# Patient Record
Sex: Female | Born: 1945
Health system: Southern US, Community
[De-identification: ages and names within clinical notes are randomized; demographics above are authoritative.]

## PROBLEM LIST (undated history)

## (undated) DIAGNOSIS — M8580 Other specified disorders of bone density and structure, unspecified site: Secondary | ICD-10-CM

## (undated) DIAGNOSIS — E2839 Other primary ovarian failure: Secondary | ICD-10-CM

## (undated) DIAGNOSIS — T7840XA Allergy, unspecified, initial encounter: Secondary | ICD-10-CM

## (undated) DIAGNOSIS — M25512 Pain in left shoulder: Secondary | ICD-10-CM

## (undated) DIAGNOSIS — E785 Hyperlipidemia, unspecified: Secondary | ICD-10-CM

## (undated) DIAGNOSIS — N952 Postmenopausal atrophic vaginitis: Secondary | ICD-10-CM

## (undated) DIAGNOSIS — R928 Other abnormal and inconclusive findings on diagnostic imaging of breast: Secondary | ICD-10-CM

## (undated) HISTORY — DX: Other primary ovarian failure: E28.39

## (undated) HISTORY — PX: TONSILLECTOMY: SUR1361

## (undated) HISTORY — DX: Hyperlipidemia, unspecified: E78.5

## (undated) HISTORY — DX: Postmenopausal atrophic vaginitis: N95.2

## (undated) HISTORY — PX: CHOLECYSTECTOMY: SHX55

## (undated) HISTORY — DX: Pain in left shoulder: M25.512

## (undated) HISTORY — DX: Allergy, unspecified, initial encounter: T78.40XA

## (undated) HISTORY — PX: BREAST BIOPSY: SHX20

## (undated) HISTORY — PX: COLONOSCOPY: SHX174

## (undated) HISTORY — DX: Other specified disorders of bone density and structure, unspecified site: M85.80

## (undated) HISTORY — DX: Other abnormal and inconclusive findings on diagnostic imaging of breast: R92.8

## (undated) HISTORY — PX: TUBAL LIGATION: SHX77

---

## 2012-08-04 DIAGNOSIS — L723 Sebaceous cyst: Secondary | ICD-10-CM | POA: Diagnosis not present

## 2012-09-28 DIAGNOSIS — J209 Acute bronchitis, unspecified: Secondary | ICD-10-CM | POA: Diagnosis not present

## 2013-01-28 DIAGNOSIS — Z1239 Encounter for other screening for malignant neoplasm of breast: Secondary | ICD-10-CM | POA: Diagnosis not present

## 2013-01-28 DIAGNOSIS — Z0000 Encounter for general adult medical examination without abnormal findings: Secondary | ICD-10-CM | POA: Diagnosis not present

## 2013-01-28 DIAGNOSIS — Z13220 Encounter for screening for lipoid disorders: Secondary | ICD-10-CM | POA: Diagnosis not present

## 2013-01-28 DIAGNOSIS — Z23 Encounter for immunization: Secondary | ICD-10-CM | POA: Diagnosis not present

## 2013-01-28 DIAGNOSIS — Z131 Encounter for screening for diabetes mellitus: Secondary | ICD-10-CM | POA: Diagnosis not present

## 2013-01-28 DIAGNOSIS — Z1159 Encounter for screening for other viral diseases: Secondary | ICD-10-CM | POA: Diagnosis not present

## 2013-01-28 DIAGNOSIS — Z124 Encounter for screening for malignant neoplasm of cervix: Secondary | ICD-10-CM | POA: Diagnosis not present

## 2013-01-31 DIAGNOSIS — Z124 Encounter for screening for malignant neoplasm of cervix: Secondary | ICD-10-CM | POA: Diagnosis not present

## 2013-02-17 ENCOUNTER — Ambulatory Visit: Payer: Self-pay | Admitting: Family Medicine

## 2013-02-17 DIAGNOSIS — E2839 Other primary ovarian failure: Secondary | ICD-10-CM | POA: Diagnosis not present

## 2013-02-17 DIAGNOSIS — M899 Disorder of bone, unspecified: Secondary | ICD-10-CM | POA: Diagnosis not present

## 2013-02-17 DIAGNOSIS — N63 Unspecified lump in unspecified breast: Secondary | ICD-10-CM | POA: Diagnosis not present

## 2013-02-17 DIAGNOSIS — M810 Age-related osteoporosis without current pathological fracture: Secondary | ICD-10-CM | POA: Diagnosis not present

## 2013-02-17 DIAGNOSIS — Z1231 Encounter for screening mammogram for malignant neoplasm of breast: Secondary | ICD-10-CM | POA: Diagnosis not present

## 2013-02-17 LAB — HM DEXA SCAN: HM DEXA SCAN: ABNORMAL

## 2013-02-20 ENCOUNTER — Ambulatory Visit: Payer: Self-pay | Admitting: Family Medicine

## 2013-02-20 DIAGNOSIS — R928 Other abnormal and inconclusive findings on diagnostic imaging of breast: Secondary | ICD-10-CM | POA: Diagnosis not present

## 2013-02-20 DIAGNOSIS — R920 Mammographic microcalcification found on diagnostic imaging of breast: Secondary | ICD-10-CM | POA: Diagnosis not present

## 2013-02-24 ENCOUNTER — Ambulatory Visit: Payer: Self-pay | Admitting: Gastroenterology

## 2013-02-24 DIAGNOSIS — D126 Benign neoplasm of colon, unspecified: Secondary | ICD-10-CM | POA: Diagnosis not present

## 2013-02-24 DIAGNOSIS — Z1211 Encounter for screening for malignant neoplasm of colon: Secondary | ICD-10-CM | POA: Diagnosis not present

## 2013-02-24 DIAGNOSIS — K648 Other hemorrhoids: Secondary | ICD-10-CM | POA: Diagnosis not present

## 2013-02-24 DIAGNOSIS — Z833 Family history of diabetes mellitus: Secondary | ICD-10-CM | POA: Diagnosis not present

## 2013-02-24 DIAGNOSIS — K5730 Diverticulosis of large intestine without perforation or abscess without bleeding: Secondary | ICD-10-CM | POA: Diagnosis not present

## 2013-02-24 DIAGNOSIS — Z8489 Family history of other specified conditions: Secondary | ICD-10-CM | POA: Diagnosis not present

## 2013-02-24 DIAGNOSIS — E739 Lactose intolerance, unspecified: Secondary | ICD-10-CM | POA: Diagnosis not present

## 2013-02-24 LAB — HM COLONOSCOPY: HM Colonoscopy: ABNORMAL

## 2013-02-25 LAB — PATHOLOGY REPORT

## 2013-02-28 ENCOUNTER — Encounter: Payer: Self-pay | Admitting: *Deleted

## 2013-03-06 ENCOUNTER — Encounter: Payer: Self-pay | Admitting: General Surgery

## 2013-03-06 ENCOUNTER — Ambulatory Visit (INDEPENDENT_AMBULATORY_CARE_PROVIDER_SITE_OTHER): Payer: Medicare Other | Admitting: General Surgery

## 2013-03-06 VITALS — BP 126/64 | HR 70 | Resp 12 | Ht 63.0 in | Wt 185.0 lb

## 2013-03-06 DIAGNOSIS — N63 Unspecified lump in unspecified breast: Secondary | ICD-10-CM

## 2013-03-06 HISTORY — PX: BREAST BIOPSY: SHX20

## 2013-03-06 NOTE — Progress Notes (Signed)
Patient ID: Vanessa Hunter, female   DOB: [birth year], 67 y.o.   MRN: [id number]  Chief Complaint  Patient presents with  . Other    mammogram    HPI Vanessa Hunter is a 67 y.o. female who presents for a breast evaluation. The most recent mammogram was done on 02/17/13 with a birad category 0. Additional views were done bilaterally on 02/20/13 with a birad category 4. A right breast ultrasound was done at that same time with a birad category 4.  Patient does perform regular self breast checks but does not get regular mammograms done.  The patient denies any problems with her breasts at this time.   HPI  Past Medical History  Diagnosis Date  . Abnormal mammogram     Past Surgical History  Procedure Laterality Date  . Breast biopsy Right   . Cholecystectomy    . Tonsillectomy    . Colonoscopy      Family History  Problem Relation Age of Onset  . Cancer Maternal Aunt     breast    Social History History  Substance Use Topics  . Smoking status: Never Smoker   . Smokeless tobacco: Never Used  . Alcohol Use: Yes    Allergies  Allergen Reactions  . Orange Juice (Orange Oil) Itching    Current Outpatient Prescriptions  Medication Sig Dispense Refill  . Calcium Carbonate-Vitamin D (CALCIUM + D PO) Take 600 mg by mouth daily.       No current facility-administered medications for this visit.    Review of Systems Review of Systems  Constitutional: Negative.   Respiratory: Negative.   Cardiovascular: Negative.     Blood pressure 126/64, pulse 70, resp. rate 12, height 5\' 3"  (1.6 m), weight 185 lb (83.915 kg).  Physical Exam Physical Exam  Constitutional: She is oriented to person, place, and time. She appears well-developed and well-nourished.  Neck: No thyromegaly present.  Cardiovascular: Normal rate, regular rhythm and normal heart sounds.   No murmur heard. Pulmonary/Chest: Effort normal and breath sounds normal. Right breast exhibits no inverted nipple, no mass, no  nipple discharge, no skin change and no tenderness. Left breast exhibits no inverted nipple, no mass, no nipple discharge, no skin change and no tenderness.  Left breast 1 cup size larger than right.     Lymphadenopathy:    She has no cervical adenopathy.    She has no axillary adenopathy.  Neurological: She is alert and oriented to person, place, and time.  Skin: Skin is warm and dry.    Data Reviewed The patient her first mammogram and at least 10-12 years on February 17 2013. A nodular density in the upper portion of the right breast was identified. 2 areas of calcifications appreciated in the left breast. Additional views requested. BI-RAD-0.  Focal spot compression views of the breast dated February 20, 2013 showed 2 clusters in the upper outer aspect. Calcification of the right breast were felt to be unremarkable. BI-RAD-4.  Ultrasound right breast dated February 20, 2013 showed a 5 mm hypoechoic nodule with internal echoes and blood flow. BI-RAD-4.  Ultrasound examination of the right breast in the 10:30 o'clock position 9 cm from the nipple showed a 0.4 x 0.43 x 0.5 one centimeter hypoechoic nodule with a slight indentation on one border. The patient was amenable to core biopsy. 10 cc of 0.5% Xylocaine with 0.25% Marcaine with 1-20,000 of epinephrine was utilized a well-tolerated. Under ultrasound guidance a 14-gauge Anesthesia was advanced and multiple samples  completed with complete removal of the lesion. A postbiopsy clip was placed. She tolerated the procedure well and skin defect was closed with benzoin and Steri-Strips followed by Telfa and Tegaderm dressing. Written instructions were provided regards to wound care.  Assessment    Small right breast nodule, like a benign.  Subtle microcalcifications in the left breast, for study and a decade. Six-month followup indicated.     Plan    The patient will follow up with the nurse in one week for evaluation of the right breast status post  biopsy. In discussion with the patient regarding the subtle findings of microcalcifications in the left breast were both comfortable with a 6 month followup.  Bilateral diagnostic mammograms will be obtained in 6 months an office visit to follow.        Greta Doom F 03/06/2013, 1:27 PM   aa

## 2013-03-06 NOTE — Patient Instructions (Addendum)

## 2013-03-07 ENCOUNTER — Telehealth: Payer: Self-pay | Admitting: General Surgery

## 2013-03-07 ENCOUNTER — Encounter: Payer: Self-pay | Admitting: General Surgery

## 2013-03-07 LAB — PATHOLOGY

## 2013-03-07 NOTE — Telephone Encounter (Signed)
The patient was notified that the recently completed biopsy showed only a benign lymph node. She reports mild soreness from the procedure. She's been encouraged to continue the use of ice for comfort.  She will follow up with the nurse next week for wound evaluation, and arrangements are in place for bilateral diagnostic mammograms in 6 months to reassess the left breast microcalcifications in the right breast status post biopsy.

## 2013-03-10 ENCOUNTER — Encounter: Payer: Self-pay | Admitting: General Surgery

## 2013-03-13 ENCOUNTER — Ambulatory Visit (INDEPENDENT_AMBULATORY_CARE_PROVIDER_SITE_OTHER): Payer: Medicare Other | Admitting: *Deleted

## 2013-03-13 DIAGNOSIS — N63 Unspecified lump in unspecified breast: Secondary | ICD-10-CM

## 2013-03-13 NOTE — Progress Notes (Signed)
Patient here today for follow up post right breast biopsy. No dressing. Steri strip in place and aware it may come off in one week.  Minimal bruising noted. Mild irritation from Tegaderm noted.   The patient is aware that a heating pad may be used for comfort as needed.  Aware of pathology. Follow up as scheduled.

## 2013-03-13 NOTE — Patient Instructions (Addendum)
Follow up in 6 months. Continue self breast exams. Call office for any new breast issues or concerns.  

## 2013-06-24 DIAGNOSIS — R030 Elevated blood-pressure reading, without diagnosis of hypertension: Secondary | ICD-10-CM | POA: Diagnosis not present

## 2013-06-24 DIAGNOSIS — K5732 Diverticulitis of large intestine without perforation or abscess without bleeding: Secondary | ICD-10-CM | POA: Diagnosis not present

## 2013-08-21 ENCOUNTER — Ambulatory Visit: Payer: Self-pay | Admitting: General Surgery

## 2013-08-21 DIAGNOSIS — R920 Mammographic microcalcification found on diagnostic imaging of breast: Secondary | ICD-10-CM | POA: Diagnosis not present

## 2013-08-21 DIAGNOSIS — R922 Inconclusive mammogram: Secondary | ICD-10-CM | POA: Diagnosis not present

## 2013-08-22 ENCOUNTER — Encounter: Payer: Self-pay | Admitting: General Surgery

## 2013-09-03 ENCOUNTER — Ambulatory Visit (INDEPENDENT_AMBULATORY_CARE_PROVIDER_SITE_OTHER): Payer: Medicare Other | Admitting: General Surgery

## 2013-09-03 ENCOUNTER — Encounter: Payer: Self-pay | Admitting: General Surgery

## 2013-09-03 VITALS — BP 142/74 | HR 72 | Resp 12 | Ht 62.0 in | Wt 176.0 lb

## 2013-09-03 DIAGNOSIS — R920 Mammographic microcalcification found on diagnostic imaging of breast: Secondary | ICD-10-CM | POA: Diagnosis not present

## 2013-09-03 NOTE — Patient Instructions (Signed)
Continue self breast exams. Call office for any new breast issues or concerns. 

## 2013-09-03 NOTE — Progress Notes (Signed)
Patient ID: Vanessa Hunter, female   DOB: 11/24/1945, 68 y.o.   MRN: [id number]  Chief Complaint  Patient presents with  . Follow-up    mammogram    HPI Vanessa Hunter is a 68 y.o. female.  who presents for her follow up breast evaluation. The most recent mammogram was done on 08-21-13.  Patient does perform regular self breast checks and gets regular mammograms done.  No new breast complaints, she does admit to few night sweats over the past 2 weeks.  She is walking more and modifying her diet weight is down 9 pounds.  HPI  Past Medical History  Diagnosis Date  . Abnormal mammogram     Past Surgical History  Procedure Laterality Date  . Cholecystectomy    . Tonsillectomy    . Colonoscopy    . Breast biopsy Right 03-06-13    RIGHT BREAST AT 10:30, intramammary lymph node    Family History  Problem Relation Age of Onset  . Cancer Maternal Aunt     breast    Social History History  Substance Use Topics  . Smoking status: Never Smoker   . Smokeless tobacco: Never Used  . Alcohol Use: Yes    Allergies  Allergen Reactions  . Orange Juice [Orange Oil] Itching    Current Outpatient Prescriptions  Medication Sig Dispense Refill  . Calcium Carbonate-Vitamin D (CALCIUM + D PO) Take 600 mg by mouth daily.       No current facility-administered medications for this visit.    Review of Systems Review of Systems  Constitutional: Negative.   Respiratory: Negative.   Cardiovascular: Negative.     Blood pressure 142/74, pulse 72, resp. rate 12, height 5\' 2"  (1.575 m), weight 176 lb (79.833 kg).  Physical Exam Physical Exam  Constitutional: She is oriented to person, place, and time. She appears well-developed and well-nourished.  Eyes: No scleral icterus.  Neck: Neck supple.  Cardiovascular: Normal rate, regular rhythm and normal heart sounds.   Pulmonary/Chest: Effort normal and breath sounds normal. Right breast exhibits no inverted nipple, no mass, no nipple discharge, no  skin change and no tenderness. Left breast exhibits no inverted nipple, no mass, no nipple discharge, no skin change and no tenderness.  Well healed biopsy site right breast, small amount residual skin irritation from tape  Lymphadenopathy:    She has no cervical adenopathy.    She has no axillary adenopathy.  Neurological: She is alert and oriented to person, place, and time.  Skin: Skin is warm and dry.    Data Reviewed Diagnosis: RIGHT BREAST AT 10:30, ULTRASOUND-GUIDED CORE BIOPSY: - SCANT FRAGMENTS OF BENIGN LYMPHOID TISSUE, PARTLY ENCAPSULATED, CONSISTENT WITH INTRAMAMMARY LYMPH NODE. NOTE: The nodal tissue is admixed with fat and blood. There is no evidence of malignancy in this specimen. Correlation with imaging findings is recommended. The report was called to Dr. Bary Castilla on 03/07/13 at 2:07 PM. MSO/03/07/2013  Postbiopsy mammogram dated 08/21/2013 showed scattered calcifications unchanged from past exams. BI-RAD-3.   Assessment    Benign breast exam.    Plan    Bilateral diagnostic mammogram and office visit in 6 months       Robert Bellow 09/04/2013, 4:47 PM

## 2013-09-04 DIAGNOSIS — R920 Mammographic microcalcification found on diagnostic imaging of breast: Secondary | ICD-10-CM | POA: Insufficient documentation

## 2014-02-26 ENCOUNTER — Encounter: Payer: Self-pay | Admitting: General Surgery

## 2014-02-26 DIAGNOSIS — R920 Mammographic microcalcification found on diagnostic imaging of breast: Secondary | ICD-10-CM | POA: Diagnosis not present

## 2014-03-03 ENCOUNTER — Encounter: Payer: Self-pay | Admitting: General Surgery

## 2014-03-03 ENCOUNTER — Ambulatory Visit (INDEPENDENT_AMBULATORY_CARE_PROVIDER_SITE_OTHER): Payer: Medicare Other | Admitting: General Surgery

## 2014-03-03 VITALS — BP 110/74 | HR 74 | Resp 14 | Ht 62.0 in | Wt 178.0 lb

## 2014-03-03 DIAGNOSIS — Z1231 Encounter for screening mammogram for malignant neoplasm of breast: Secondary | ICD-10-CM | POA: Diagnosis not present

## 2014-03-03 NOTE — Patient Instructions (Signed)
Patient to return in 6 month with a bilateral diagnotic mammogram.

## 2014-03-03 NOTE — Progress Notes (Signed)
Patient ID: Vanessa Hunter, female   DOB: 12/22/1945, 68 y.o.   MRN: [id number]  Chief Complaint  Patient presents with  . Follow-up    mammogram    HPI Vanessa Hunter is a 68 y.o. female who presents for a breast evaluation. The most recent mammogram was done on 02/26/14 at [facility]. Patient does perform regular self breast checks and gets regular mammograms done.  No complaints at this time.   HPI  Past Medical History  Diagnosis Date  . Abnormal mammogram     Past Surgical History  Procedure Laterality Date  . Cholecystectomy    . Tonsillectomy    . Colonoscopy    . Breast biopsy Right 03-06-13    RIGHT BREAST AT 10:30, intramammary lymph node    Family History  Problem Relation Age of Onset  . Cancer Maternal Aunt     breast  . Colon cancer Other     Social History History  Substance Use Topics  . Smoking status: Never Smoker   . Smokeless tobacco: Never Used  . Alcohol Use: Yes    Allergies  Allergen Reactions  . Orange Juice [Orange Oil] Itching    Current Outpatient Prescriptions  Medication Sig Dispense Refill  . Calcium Carbonate-Vitamin D (CALCIUM + D PO) Take 600 mg by mouth daily.       No current facility-administered medications for this visit.    Review of Systems Review of Systems  Constitutional: Negative.   Respiratory: Negative.   Cardiovascular: Negative.     Blood pressure 110/74, pulse 74, resp. rate 14, height 5\' 2"  (1.575 m), weight 178 lb (80.74 kg).  Physical Exam Physical Exam  Constitutional: She is oriented to person, place, and time. She appears well-developed and well-nourished.  Eyes: Conjunctivae are normal. No scleral icterus.  Neck: Neck supple.  Cardiovascular: Normal rate, regular rhythm and normal heart sounds.   Pulmonary/Chest: Effort normal and breath sounds normal. Right breast exhibits no inverted nipple, no mass, no nipple discharge, no skin change and no tenderness. Left breast exhibits no inverted nipple, no mass,  no nipple discharge, no skin change and no tenderness.  Lymphadenopathy:    She has no cervical adenopathy.    She has no axillary adenopathy.  Neurological: She is alert and oriented to person, place, and time.  Skin: Skin is warm and dry.    Data Reviewed Bilateral mammograms dated 02/26/2014 completed at [facility] she was stable calcifications when compared to previous studies. Previous right breast biopsy clip present in the upper outer quadrant of. BI-RAD-3.  These films were independently reviewed.  RIGHT BREAST AT 10:30, ULTRASOUND-GUIDED CORE BIOPSY: - SCANT FRAGMENTS OF BENIGN LYMPHOID TISSUE, PARTLY ENCAPSULATED, CONSISTENT WITH INTRAMAMMARY LYMPH NODE. NOTE: The nodal tissue is admixed with fat and blood. There is no evidence of malignancy in this specimen. Correlation with imaging findings is recommended. The report was called to Dr. Bary Castilla on 03/07/13 at 2:07 PM. MSO/03/07/2013   Assessment    Right intramammary lymph node, previous biopsy benign.  Scattered microcalcifications.    Plan    Options for management were reviewed the patient: 1) radiological recommendation of six-month followup versus 2) one year followup. At this time the patient desires a six-month followup exam and this will be arranged.    PCP: Frazier Butt 03/04/2014, 4:33 PM

## 2014-03-06 DIAGNOSIS — R030 Elevated blood-pressure reading, without diagnosis of hypertension: Secondary | ICD-10-CM | POA: Diagnosis not present

## 2014-03-06 DIAGNOSIS — Z124 Encounter for screening for malignant neoplasm of cervix: Secondary | ICD-10-CM | POA: Diagnosis not present

## 2014-03-06 DIAGNOSIS — Z1331 Encounter for screening for depression: Secondary | ICD-10-CM | POA: Diagnosis not present

## 2014-03-06 DIAGNOSIS — B977 Papillomavirus as the cause of diseases classified elsewhere: Secondary | ICD-10-CM | POA: Diagnosis not present

## 2014-03-06 DIAGNOSIS — Z0000 Encounter for general adult medical examination without abnormal findings: Secondary | ICD-10-CM | POA: Diagnosis not present

## 2014-03-06 DIAGNOSIS — Z113 Encounter for screening for infections with a predominantly sexual mode of transmission: Secondary | ICD-10-CM | POA: Diagnosis not present

## 2014-03-06 DIAGNOSIS — Z1239 Encounter for other screening for malignant neoplasm of breast: Secondary | ICD-10-CM | POA: Diagnosis not present

## 2014-03-06 LAB — LIPID PANEL
CHOLESTEROL: 228 mg/dL — AB (ref 0–200)
HDL: 74 mg/dL — AB (ref 35–70)
LDL CALC: 134 mg/dL
TRIGLYCERIDES: 102 mg/dL (ref 40–160)

## 2014-03-06 LAB — HM PAP SMEAR: HM Pap smear: NORMAL

## 2014-06-15 ENCOUNTER — Encounter: Payer: Self-pay | Admitting: General Surgery

## 2014-06-15 DIAGNOSIS — R109 Unspecified abdominal pain: Secondary | ICD-10-CM | POA: Diagnosis not present

## 2014-08-21 ENCOUNTER — Encounter: Payer: Self-pay | Admitting: General Surgery

## 2014-08-21 DIAGNOSIS — R921 Mammographic calcification found on diagnostic imaging of breast: Secondary | ICD-10-CM | POA: Diagnosis not present

## 2014-08-21 LAB — HM MAMMOGRAPHY: HM Mammogram: NORMAL

## 2014-08-25 ENCOUNTER — Ambulatory Visit (INDEPENDENT_AMBULATORY_CARE_PROVIDER_SITE_OTHER): Payer: Medicare Other | Admitting: General Surgery

## 2014-08-25 ENCOUNTER — Encounter: Payer: Self-pay | Admitting: General Surgery

## 2014-08-25 VITALS — BP 120/82 | HR 64 | Resp 12 | Ht 62.0 in | Wt 186.0 lb

## 2014-08-25 DIAGNOSIS — R920 Mammographic microcalcification found on diagnostic imaging of breast: Secondary | ICD-10-CM | POA: Diagnosis not present

## 2014-08-25 NOTE — Progress Notes (Signed)
Patient ID: Vanessa Hunter, female   DOB: 07-05-1946, 69 y.o.   MRN: [id number]  Chief Complaint  Patient presents with  . Follow-up    mammogram    HPI Vanessa Hunter is a 69 y.o. female who presents for a breast evaluation. The most recent mammogram was done on 08/21/14. Patient does perform regular self breast checks and gets regular mammograms done. The patient denies any new breast problems at this time.    HPI  Past Medical History  Diagnosis Date  . Abnormal mammogram     Past Surgical History  Procedure Laterality Date  . Cholecystectomy    . Tonsillectomy    . Colonoscopy    . Breast biopsy Right 03-06-13    RIGHT BREAST AT 10:30, intramammary lymph node    Family History  Problem Relation Age of Onset  . Cancer Maternal Aunt     breast  . Colon cancer Other     Social History History  Substance Use Topics  . Smoking status: Never Smoker   . Smokeless tobacco: Never Used  . Alcohol Use: Yes    Allergies  Allergen Reactions  . Orange Juice [Orange Oil] Itching    No current outpatient prescriptions on file.   No current facility-administered medications for this visit.    Review of Systems Review of Systems  Constitutional: Negative.   Respiratory: Negative.   Cardiovascular: Negative.     Blood pressure 120/82, pulse 64, resp. rate 12, height 5\' 2"  (1.575 m), weight 186 lb (84.369 kg).  Physical Exam Physical Exam  Constitutional: She is oriented to person, place, and time. She appears well-developed and well-nourished.  Neck: Neck supple. No thyromegaly present.  Cardiovascular: Normal rate, regular rhythm and normal heart sounds.   No murmur heard. Pulmonary/Chest: Effort normal and breath sounds normal. Right breast exhibits no inverted nipple, no mass, no nipple discharge, no skin change and no tenderness. Left breast exhibits no inverted nipple, no mass, no nipple discharge, no skin change and no tenderness.  Lymphadenopathy:    She has no  cervical adenopathy.    She has no axillary adenopathy.  Neurological: She is alert and oriented to person, place, and time.  Skin: Skin is warm and dry.    Data Reviewed 03/06/2014 vacuum biopsy: RIGHT BREAST AT 10:30, ULTRASOUND-GUIDED CORE BIOPSY: - SCANT FRAGMENTS OF BENIGN LYMPHOID TISSUE, PARTLY ENCAPSULATED, CONSISTENT WITH INTRAMAMMARY LYMPH NODE. NOTE: The nodal tissue is admixed with fat and blood. There is no evidence of malignancy in this specimen. Correlation with imaging findings is recommended.  Bilateral diagnostic mammogram dated 08/21/2014 were independently reviewed. Stable microcalcifications. BI-RADS-2. Not commented upon his deep previously placed clip in the lateral aspect of the right breast. Assessment    Benign breast exam and mammogram.     Plan    The patient should resume annual screening mammograms with her primary care provider. Follow-up here will be on an as-needed basis.     PCP:  Maryanna Shape 08/26/2014, 7:19 AM

## 2014-08-25 NOTE — Patient Instructions (Signed)
Patient to return as needed. Continue self breast exams. Call office for any new breast issues or concerns. Patient advised to continue yearly mammograms.

## 2015-01-12 DIAGNOSIS — L729 Follicular cyst of the skin and subcutaneous tissue, unspecified: Secondary | ICD-10-CM | POA: Diagnosis not present

## 2015-01-18 ENCOUNTER — Telehealth: Payer: Self-pay | Admitting: Family Medicine

## 2015-01-18 ENCOUNTER — Encounter (INDEPENDENT_AMBULATORY_CARE_PROVIDER_SITE_OTHER): Payer: Self-pay

## 2015-01-18 NOTE — Telephone Encounter (Signed)
Spoke with patient and informed her of her Skin Culture results, and patient stated the infection is getting better. Not as tender as it was and the spot is decreasing in size.

## 2015-01-18 NOTE — Telephone Encounter (Signed)
Have we received blood work on this patient yet?

## 2015-01-18 NOTE — Telephone Encounter (Signed)
Pt called wanting to know the results of her recent lab results.  She was having trouble with her mychart, hence the reason for her call. Pt said it would be ok to lvm.

## 2015-06-10 ENCOUNTER — Encounter: Payer: Self-pay | Admitting: Family Medicine

## 2015-06-10 ENCOUNTER — Ambulatory Visit (INDEPENDENT_AMBULATORY_CARE_PROVIDER_SITE_OTHER): Payer: Medicare Other | Admitting: Family Medicine

## 2015-06-10 VITALS — BP 136/78 | HR 87 | Temp 99.0°F | Resp 14 | Ht 63.0 in | Wt 200.4 lb

## 2015-06-10 DIAGNOSIS — Z1231 Encounter for screening mammogram for malignant neoplasm of breast: Secondary | ICD-10-CM | POA: Diagnosis not present

## 2015-06-10 DIAGNOSIS — J309 Allergic rhinitis, unspecified: Secondary | ICD-10-CM | POA: Insufficient documentation

## 2015-06-10 DIAGNOSIS — E785 Hyperlipidemia, unspecified: Secondary | ICD-10-CM | POA: Diagnosis not present

## 2015-06-10 DIAGNOSIS — R928 Other abnormal and inconclusive findings on diagnostic imaging of breast: Secondary | ICD-10-CM | POA: Insufficient documentation

## 2015-06-10 DIAGNOSIS — B977 Papillomavirus as the cause of diseases classified elsewhere: Secondary | ICD-10-CM

## 2015-06-10 DIAGNOSIS — R030 Elevated blood-pressure reading, without diagnosis of hypertension: Secondary | ICD-10-CM

## 2015-06-10 DIAGNOSIS — N952 Postmenopausal atrophic vaginitis: Secondary | ICD-10-CM | POA: Insufficient documentation

## 2015-06-10 DIAGNOSIS — Z1151 Encounter for screening for human papillomavirus (HPV): Secondary | ICD-10-CM | POA: Diagnosis not present

## 2015-06-10 DIAGNOSIS — Z0000 Encounter for general adult medical examination without abnormal findings: Secondary | ICD-10-CM | POA: Diagnosis not present

## 2015-06-10 DIAGNOSIS — Z8719 Personal history of other diseases of the digestive system: Secondary | ICD-10-CM | POA: Insufficient documentation

## 2015-06-10 DIAGNOSIS — Z23 Encounter for immunization: Secondary | ICD-10-CM

## 2015-06-10 DIAGNOSIS — M25519 Pain in unspecified shoulder: Secondary | ICD-10-CM | POA: Insufficient documentation

## 2015-06-10 DIAGNOSIS — IMO0001 Reserved for inherently not codable concepts without codable children: Secondary | ICD-10-CM

## 2015-06-10 DIAGNOSIS — M8580 Other specified disorders of bone density and structure, unspecified site: Secondary | ICD-10-CM | POA: Insufficient documentation

## 2015-06-10 NOTE — Progress Notes (Signed)
Name: Vanessa Hunter   MRN: [id number]    DOB: 1945/10/22   Date:06/10/2015       Progress Note  Subjective  Chief Complaint  Chief Complaint  Patient presents with  . Annual Exam    HPI  Functional ability/safety issues: No Issues Hearing issues: Addressed  Activities of daily living: Discussed Home safety issues: No Issues  End Of Life Planning: she has  advanced directives, healthcare power of attorney.  Preventative care, Health maintenance, Preventative health measures discussed.  Preventative screenings discussed today: lab work, colonoscopy, PAP, mammogram, DEXA.  Low Dose CT Chest recommended if Age 83-80 years, 30 pack-year currently smoking OR have quit w/in 15years.   Lifestyle risk factor issued reviewed: Diet, exercise, weight management, nutrition/diet.  Preventative health measures discussed (5-10 year plan).  Reviewed and recommended vaccinations: - Pneumovax  - Prevnar  - Annual Influenza -refused - Zostavax - Tdap -not covered by insurance  Depression screening: Done Fall risk screening: Done Discuss ADLs/IADLs: Done  Current medical providers: See HPI  Other health risk factors identified this visit: No other issues Cognitive impairment issues: None identified  All above discussed with patient. Appropriate education, counseling and referral will be made based upon the above.   Dyslipidemia: on diet only, not on statin therapy.  Obesity: she states she has been at the gym three times weekly , gained 5 lbs since last visit, states has occasional sweet tea, and dessert, but usually eats very healthy.   Blood Pressure: today, bp was elevated, but usually at goal, no chest pain or palpitation.   Patient Active Problem List   Diagnosis Date Noted  . Allergic rhinitis 06/10/2015  . Dyslipidemia 06/10/2015  . Osteopenia 06/10/2015  . Pain in shoulder 06/10/2015  . Atrophy of vagina 06/10/2015  . History of diverticulitis 06/10/2015  . Lump or  mass in breast 03/06/2013    Past Surgical History  Procedure Laterality Date  . Cholecystectomy    . Tonsillectomy    . Colonoscopy    . Breast biopsy Right 03-06-13    RIGHT BREAST AT 10:30, intramammary lymph node  . Tubal ligation      Family History  Problem Relation Age of Onset  . Cancer Maternal Aunt     breast  . Colon cancer Other   . Hearing loss Mother   . Migraines Mother   . Heart disease Father   . Heart disease Brother     Social History   Social History  . Marital Status: Widowed    Spouse Name: N/A  . Number of Children: N/A  . Years of Education: N/A   Occupational History  . Not on file.   Social History Main Topics  . Smoking status: Never Smoker   . Smokeless tobacco: Never Used  . Alcohol Use: Yes  . Drug Use: No  . Sexual Activity: Not on file   Other Topics Concern  . Not on file   Social History Narrative    No current outpatient prescriptions on file.  Allergies  Allergen Reactions  . Lac Bovis     lactose intolerance  . Orange Juice [Orange Oil] Itching     ROS  Constitutional: Negative for fever, positive for  weight change.  Respiratory: Negative for cough and shortness of breath.   Cardiovascular: Negative for chest pain or palpitations.  Gastrointestinal: Negative for abdominal pain, no bowel changes.  Musculoskeletal: Negative for gait problem or joint swelling.  Skin: Negative for rash.  Neurological: Negative  for dizziness or headache.  No other specific complaints in a complete review of systems (except as listed in HPI above).  Objective  Filed Vitals:   06/10/15 1120  BP: 152/80  Pulse: 87  Temp: 99 F (37.2 C)  TempSrc: Oral  Resp: 14  Height: 5\' 3"  (1.6 m)  Weight: 200 lb 6.4 oz (90.901 kg)  SpO2: 93%    Body mass index is 35.51 kg/(m^2).  Physical Exam  Constitutional: Patient appears well-developed and well-nourished, obese. No distress.  HENT: Head: Normocephalic and atraumatic. Ears: B  TMs ok, no erythema or effusion; Nose: Nose normal. Mouth/Throat: Oropharynx is clear and moist. No oropharyngeal exudate.  Eyes: Conjunctivae and EOM are normal. Pupils are equal, round, and reactive to light. No scleral icterus.  Neck: Normal range of motion. Neck supple. No JVD present. No thyromegaly present.  Cardiovascular: Normal rate, regular rhythm and normal heart sounds.  No murmur heard. No BLE edema. Pulmonary/Chest: Effort normal and breath sounds normal. No respiratory distress. Abdominal: Soft. Bowel sounds are normal, no distension. There is no tenderness. no masses Breast: no lumps or masses, no nipple discharge or rashes FEMALE GENITALIA:  External genitalia normal External urethra normal Vaginal vault normal without discharge or lesions Cervix normal without discharge or lesions Bimanual exam normal without masses RECTAL: not done Musculoskeletal: Normal range of motion, no joint effusions. No gross deformities Neurological: he is alert and oriented to person, place, and time. No cranial nerve deficit. Coordination, balance, strength, speech and gait are normal.  Skin: Skin is warm and dry. No rash noted. No erythema.  Psychiatric: Patient has a normal mood and affect. behavior is normal. Judgment and thought content normal.   PHQ2/9: Depression screen PHQ 2/9 06/10/2015  Decreased Interest 0  Down, Depressed, Hopeless 0  PHQ - 2 Score 0    Fall Risk: Fall Risk  06/10/2015  Falls in the past year? No    Functional Status Survey: Is the patient deaf or have difficulty hearing?: No Does the patient have difficulty seeing, even when wearing glasses/contacts?: Yes (reading glasses) Does the patient have difficulty concentrating, remembering, or making decisions?: No Does the patient have difficulty walking or climbing stairs?: No Does the patient have difficulty dressing or bathing?: No Does the patient have difficulty doing errands alone such as visiting a  doctor's office or shopping?: No    Assessment & Plan  1. Medicare annual wellness visit, subsequent  Discussed with adolescent  and caregiver the importance of limiting screen time to no more than 2 hours per day, exercise daily for at least 2 hours, eat 6 servings of fruit and vegetables daily, eat tree nuts ( pistachios, pecans , almonds...) one serving every other day, eat fish twice weekly. Read daily. Get involved in school. Have responsibilities  at home. To avoid STI's, practice abstinence, if unable use condoms and stick with one partner.  Discussed importance of contraception if sexually active to avoid unwanted pregnancy.   2. Needs flu shot  - Flu vaccine HIGH DOSE PF (Fluzone High dose) -refused  3. Dyslipidemia  - Lipid panel  4. Infection due to human papilloma virus (HPV) in female  - PapLb, HPV, rfx16/18  5. Encounter for screening mammogram for breast cancer  -mammogram bilateral screen  6. Blood pressure elevated  - Comprehensive metabolic panel - CBC with Differential/Platelet  7. Need for pneumococcal vaccination  - Pneumococcal conjugate vaccine 13-valent IM - she left before we gave it to her

## 2015-06-15 LAB — PAPLB, HPV, RFX16/18: PAP Smear Comment: 0

## 2015-10-22 ENCOUNTER — Encounter: Payer: Self-pay | Admitting: *Deleted

## 2016-07-19 ENCOUNTER — Encounter: Payer: Self-pay | Admitting: Family Medicine

## 2016-07-19 ENCOUNTER — Ambulatory Visit (INDEPENDENT_AMBULATORY_CARE_PROVIDER_SITE_OTHER): Payer: Medicare Other | Admitting: Family Medicine

## 2016-07-19 VITALS — BP 136/74 | HR 73 | Temp 98.1°F | Resp 16 | Ht 63.0 in | Wt 187.9 lb

## 2016-07-19 DIAGNOSIS — Z0000 Encounter for general adult medical examination without abnormal findings: Secondary | ICD-10-CM

## 2016-07-19 DIAGNOSIS — E785 Hyperlipidemia, unspecified: Secondary | ICD-10-CM | POA: Diagnosis not present

## 2016-07-19 DIAGNOSIS — Z1231 Encounter for screening mammogram for malignant neoplasm of breast: Secondary | ICD-10-CM

## 2016-07-19 DIAGNOSIS — R634 Abnormal weight loss: Secondary | ICD-10-CM | POA: Diagnosis not present

## 2016-07-19 DIAGNOSIS — Z23 Encounter for immunization: Secondary | ICD-10-CM

## 2016-07-19 LAB — TSH: TSH: 1.84 m[IU]/L

## 2016-07-19 LAB — CBC WITH DIFFERENTIAL/PLATELET
BASOS ABS: 74 {cells}/uL (ref 0–200)
BASOS PCT: 1 %
EOS ABS: 148 {cells}/uL (ref 15–500)
EOS PCT: 2 %
HCT: 42.1 % (ref 35.0–45.0)
HEMOGLOBIN: 13.7 g/dL (ref 11.7–15.5)
LYMPHS ABS: 1554 {cells}/uL (ref 850–3900)
Lymphocytes Relative: 21 %
MCH: 30.6 pg (ref 27.0–33.0)
MCHC: 32.5 g/dL (ref 32.0–36.0)
MCV: 94 fL (ref 80.0–100.0)
MONOS PCT: 7 %
MPV: 11.5 fL (ref 7.5–12.5)
Monocytes Absolute: 518 cells/uL (ref 200–950)
NEUTROS ABS: 5106 {cells}/uL (ref 1500–7800)
Neutrophils Relative %: 69 %
PLATELETS: 267 10*3/uL (ref 140–400)
RBC: 4.48 MIL/uL (ref 3.80–5.10)
RDW: 12.9 % (ref 11.0–15.0)
WBC: 7.4 10*3/uL (ref 3.8–10.8)

## 2016-07-19 LAB — COMPLETE METABOLIC PANEL WITH GFR
ALT: 8 U/L (ref 6–29)
AST: 14 U/L (ref 10–35)
Albumin: 4 g/dL (ref 3.6–5.1)
Alkaline Phosphatase: 70 U/L (ref 33–130)
BUN: 13 mg/dL (ref 7–25)
CHLORIDE: 103 mmol/L (ref 98–110)
CO2: 27 mmol/L (ref 20–31)
Calcium: 8.9 mg/dL (ref 8.6–10.4)
Creat: 0.71 mg/dL (ref 0.50–0.99)
GFR, EST NON AFRICAN AMERICAN: 87 mL/min (ref >=60)
GFR, Est African American: >89 mL/min (ref >=60)
GLUCOSE: 95 mg/dL (ref 65–99)
POTASSIUM: 3.3 mmol/L — AB (ref 3.5–5.3)
SODIUM: 142 mmol/L (ref 135–146)
Total Bilirubin: 0.5 mg/dL (ref 0.2–1.2)
Total Protein: 7.4 g/dL (ref 6.1–8.1)

## 2016-07-19 LAB — LIPID PANEL
CHOL/HDL RATIO: 3.4 ratio (ref <5.0)
Cholesterol: 203 mg/dL — ABNORMAL HIGH (ref <200)
HDL: 60 mg/dL (ref >50)
LDL Cholesterol: 124 mg/dL — ABNORMAL HIGH (ref <100)
Triglycerides: 96 mg/dL (ref <150)
VLDL: 19 mg/dL (ref <30)

## 2016-07-19 NOTE — Progress Notes (Signed)
Name: Vanessa Hunter   MRN: [id number]    DOB: September 16, 1945   Date:07/19/2016       Progress Note  Subjective  Chief Complaint  Chief Complaint  Patient presents with  . Annual Exam    HPI  Functional ability/safety issues: No Issues Hearing issues: Addressed  Activities of daily living: Discussed Home safety issues: No Issues  End Of Life Planning: she has  advanced directives, healthcare power of attorney.  Preventative care, Health maintenance, Preventative health measures discussed.  Preventative screenings discussed today: lab work, colonoscopy, PAP - she is sexually active but wants to wait to repeat pap next year,  mammogram, DEXA.  Low Dose CT Chest recommended if Age 69-80 years, 30 pack-year currently smoking OR have quit w/in 15years.   Lifestyle risk factor issued reviewed: Diet, exercise, weight management, nutrition/diet.  Preventative health measures discussed (5-10 year plan).  Reviewed and recommended vaccinations: - Pneumovax  - Prevnar  - Annual Influenza -refused - Zostavax - refused because cost - Tdap -not covered by insurance  Depression screening: Done Fall risk screening: Done Discuss ADLs/IADLs: Done  Current medical providers: none  Other health risk factors identified this visit: No other issues Cognitive impairment issues: None identified  All above discussed with patient. Appropriate education, counseling and referral will be made based upon the above.   Dyslipidemia: on diet only, not on statin therapy, we will order labs today  Obesity: she states she has been at the gym three times weekly , she cooks at home and is active  Weight loss: she has lost 12 lbs since last visit, she is not trying not lose weight. She eats healthy and exercises, goes to the gym also raking leaves in her yard. She states appetite is good, but not sure why she is losing weight    Patient Active Problem List   Diagnosis Date Noted  . Allergic  rhinitis 06/10/2015  . Dyslipidemia 06/10/2015  . Osteopenia 06/10/2015  . Pain in shoulder 06/10/2015  . Atrophy of vagina 06/10/2015  . History of diverticulitis 06/10/2015  . Lump or mass in breast 03/06/2013    Past Surgical History:  Procedure Laterality Date  . BREAST BIOPSY Right 03-06-13   RIGHT BREAST AT 10:30, intramammary lymph node  . CHOLECYSTECTOMY    . COLONOSCOPY    . TONSILLECTOMY    . TUBAL LIGATION      Family History  Problem Relation Age of Onset  . Hearing loss Mother   . Migraines Mother   . Heart disease Father   . Heart disease Brother   . Cancer Maternal Aunt     breast  . Colon cancer Other   . Cancer Other     Colon-Niece  . Hyperlipidemia Sister     Social History   Social History  . Marital status: Widowed    Spouse name: N/A  . Number of children: N/A  . Years of education: N/A   Occupational History  . Not on file.   Social History Main Topics  . Smoking status: Never Smoker  . Smokeless tobacco: Never Used  . Alcohol use No     Comment: Has not had a drink in 4 years   . Drug use: No  . Sexual activity: Yes   Other Topics Concern  . Not on file   Social History Narrative   She is dating Berneta Sages since July 2017. They have know each other for a long time, but started dating this Summer.  Son lives in [place].   She is originally from [place], moved to [place] and after retirement moved with husband to [place], but once her husband died she moved back to [facility].    Mother still living and also sister are here in [place].    She has two grandson's     No current outpatient prescriptions on file.  Allergies  Allergen Reactions  . Lac Bovis     lactose intolerance  . Orange Juice [Orange Oil] Itching     ROS  Constitutional: Negative for fever, positive for weight change.  Respiratory: Negative for cough and shortness of breath.   Cardiovascular: Negative for chest pain or palpitations.  Gastrointestinal: Negative for abdominal pain,  no bowel changes.  Musculoskeletal: Negative for gait problem or joint swelling.  Skin: Negative for rash.  Neurological: Negative for dizziness or headache.  No other specific complaints in a complete review of systems (except as listed in HPI above).  Objective  Vitals:   07/19/16 0818  BP: 136/74  Pulse: 73  Resp: 16  Temp: 98.1 F (36.7 C)  TempSrc: Oral  SpO2: 97%  Weight: 187 lb 14.4 oz (85.2 kg)  Height: 5\' 3"  (1.6 m)    Body mass index is 33.28 kg/m.  Physical Exam  Constitutional: Patient appears well-developed and obese. No distress.  HENT: Head: Normocephalic and atraumatic. Ears: B TMs ok, no erythema or effusion; Nose: Nose normal. Mouth/Throat: Oropharynx is clear and moist. No oropharyngeal exudate.  Eyes: Conjunctivae and EOM are normal. Pupils are equal, round, and reactive to light. No scleral icterus.  Neck: Normal range of motion. Neck supple. No JVD present. No thyromegaly present.  Cardiovascular: Normal rate, regular rhythm and normal heart sounds.  No murmur heard. No BLE edema. Pulmonary/Chest: Effort normal and breath sounds normal. No respiratory distress. Abdominal: Soft. Bowel sounds are normal, no distension. There is no tenderness. no masses Breast: no lumps or masses, no nipple discharge or rashes FEMALE GENITALIA:  External genitalia shows atrophy and hypopigmentation, possible vitiligo  External urethra normal RECTAL: not done Musculoskeletal: Normal range of motion, no joint effusions. No gross deformities Neurological: he is alert and oriented to person, place, and time. No cranial nerve deficit. Coordination, balance, strength, speech and gait are normal.  Skin: Skin is warm and dry. No rash noted. No erythema.  Psychiatric: Patient has a normal mood and affect. behavior is normal. Judgment and thought content normal.  PHQ2/9: Depression screen [facility] 2/9 07/19/2016 06/10/2015  Decreased Interest 0 0  Down, Depressed, Hopeless 0 0  PHQ -  2 Score 0 0    Fall Risk: Fall Risk  07/19/2016 06/10/2015  Falls in the past year? No No     Functional Status Survey: Is the patient deaf or have difficulty hearing?: No Does the patient have difficulty seeing, even when wearing glasses/contacts?: No Does the patient have difficulty concentrating, remembering, or making decisions?: No Does the patient have difficulty walking or climbing stairs?: No Does the patient have difficulty dressing or bathing?: No Does the patient have difficulty doing errands alone such as visiting a doctor's office or shopping?: No    Assessment & Plan  1. Medicare annual wellness visit, subsequent  Discussed importance of 150 minutes of physical activity weekly, eat two servings of fish weekly, eat one serving of tree nuts ( cashews, pistachios, pecans, almonds.Marland Kitchen) every other day, eat 6 servings of fruit/vegetables daily and drink plenty of water and avoid sweet beverages.   2. Needs flu  shot  refused  3. Dyslipidemia  - EKG 12-Lead - Lipid panel  4. Encounter for screening mammogram for breast cancer  - MM Digital Screening; Future  5. Need for pneumococcal vaccination  - Pneumococcal conjugate vaccine 13-valent IM  6. Weight loss  - COMPLETE METABOLIC PANEL WITH GFR - TSH - CBC with Differential/Platelet - Sedimentation rate - C-reactive protein  7. Need for diphtheria-tetanus-pertussis (Tdap) vaccine  Refused because of cost  8. Need for shingles vaccine  Refused because of cost

## 2016-07-20 LAB — C-REACTIVE PROTEIN: CRP: 9.7 mg/L — AB (ref <8.0)

## 2016-07-20 LAB — SEDIMENTATION RATE: SED RATE: 20 mm/h (ref 0–30)

## 2016-07-21 ENCOUNTER — Other Ambulatory Visit: Payer: Self-pay | Admitting: Family Medicine

## 2016-07-21 DIAGNOSIS — R7982 Elevated C-reactive protein (CRP): Secondary | ICD-10-CM

## 2016-08-16 ENCOUNTER — Ambulatory Visit
Admission: RE | Admit: 2016-08-16 | Discharge: 2016-08-16 | Disposition: A | Discharge status: Home / Self Care | Payer: Medicare Other | Source: Ambulatory Visit | Attending: Family Medicine | Admitting: Family Medicine

## 2016-08-16 DIAGNOSIS — Z1231 Encounter for screening mammogram for malignant neoplasm of breast: Secondary | ICD-10-CM | POA: Insufficient documentation

## 2016-11-22 ENCOUNTER — Ambulatory Visit (INDEPENDENT_AMBULATORY_CARE_PROVIDER_SITE_OTHER): Payer: Medicare Other | Admitting: Family Medicine

## 2016-11-22 ENCOUNTER — Encounter: Payer: Self-pay | Admitting: Family Medicine

## 2016-11-22 VITALS — BP 142/66 | HR 90 | Temp 98.1°F | Resp 18 | Ht 63.0 in | Wt 188.4 lb

## 2016-11-22 DIAGNOSIS — R059 Cough, unspecified: Secondary | ICD-10-CM

## 2016-11-22 DIAGNOSIS — J0100 Acute maxillary sinusitis, unspecified: Secondary | ICD-10-CM | POA: Diagnosis not present

## 2016-11-22 DIAGNOSIS — R05 Cough: Secondary | ICD-10-CM

## 2016-11-22 MED ORDER — AMOXICILLIN-POT CLAVULANATE 875-125 MG PO TABS: 1.0000 | ORAL_TABLET | Freq: Two times a day (BID) | ORAL | 0 refills | Status: DC

## 2016-11-22 NOTE — Progress Notes (Signed)
Name: Vanessa Hunter   MRN: [id number]    DOB: 15-May-1946   Date:11/22/2016       Progress Note  Subjective  Chief Complaint  Chief Complaint  Patient presents with  . URI    Onset-2 weeks-has been experiencing clear mucus cough, rattling in her chest, nasal congestion, post nasal drainage. Has been taking NyQuil, Alka Seltzer Plus and Zytrec with some relief.     HPI  Pt presents with 2 week history of nasal congestion; endorses Subjective fevers/chills, hoarse voice, sinus pain/pressure, and non-productive cough. She has tried Nyquil, alka-seltzer, honey and lemon tea, Flonase, and zyrtec with minimal relief. Denies Fatigue or NVD, shortness of breath, or chest pain.  Patient Active Problem List   Diagnosis Date Noted  . Allergic rhinitis 06/10/2015  . Dyslipidemia 06/10/2015  . Osteopenia 06/10/2015  . Pain in shoulder 06/10/2015  . Atrophy of vagina 06/10/2015  . History of diverticulitis 06/10/2015  . Lump or mass in breast 03/06/2013   Social History  Substance Use Topics  . Smoking status: Never Smoker  . Smokeless tobacco: Never Used  . Alcohol use No     Comment: Has not had a drink in 5 years    No current outpatient prescriptions on file.  Allergies  Allergen Reactions  . Lac Bovis     lactose intolerance  . Orange Juice [Orange Oil] Itching    ROS  Constitutional: Negative for weight change. Positive for subjective fevers and chills. Respiratory: Negative for shortness of breath.  Positive for cough HEENT: Positive for Sinus congestion and pain/pressure and hoarse voice Cardiovascular: Negative for chest pain or palpitations.  Gastrointestinal: Negative for abdominal pain, no bowel changes.  Musculoskeletal: Negative for body aches, gait problem or joint swelling.  Skin: Negative for rash.  Neurological: Negative for dizziness. Positive for sinus headache.  No other specific complaints in a complete review of systems (except as listed in HPI  above).  Objective  Vitals:   11/22/16 1516  BP: (!) 142/66  Pulse: 90  Resp: 18  Temp: 98.1 F (36.7 C)  TempSrc: Oral  SpO2: 96%  Weight: 188 lb 6.4 oz (85.5 kg)  Height: 5\' 3"  (1.6 m)    Body mass index is 33.37 kg/m.  Physical Exam  Constitutional: Patient appears well-developed and well-nourished. No distress.  HEENT: head atraumatic, normocephalic, , ears -TM WNL, neck supple without lymphadenopathy, throat pink and moist - no exudate or erythema. Maxillary sinus tenderness on palpation. Cardiovascular: Normal rate, regular rhythm and normal heart sounds.  No murmur heard. No BLE edema. Pulmonary/Chest: Effort normal and breath sounds clear - no wheezes or rhonchi. No respiratory distress. Abdominal: Soft.  There is no tenderness. Psychiatric: Patient has a normal mood and affect. behavior is normal. Judgment and thought content normal.  No results found for this or any previous visit (from the past 2160 hour(s)).  Assessment & Plan  1. Acute non-recurrent maxillary sinusitis - amoxicillin-clavulanate (AUGMENTIN) 875-125 MG tablet; Take 1 tablet by mouth 2 (two) times daily.  Dispense: 20 tablet; Refill: 0 -Continue OTC Flonase and Zyrtec - Push fluids. - RTC for new or worsening symptoms or no improvement in 3-5 days.  2. Cough - Patient declines Symbicort or Tessalon to help with cough - Continue home remedies including tea with honey and throat lozenges. - Discussed red flags and when to present for emergency care including new/worsening symptoms, shortness of breath or chest pain.

## 2017-07-23 ENCOUNTER — Encounter: Payer: Medicare Other | Admitting: Family Medicine

## 2017-07-28 ENCOUNTER — Encounter: Payer: Self-pay | Admitting: Family Medicine

## 2017-07-28 ENCOUNTER — Ambulatory Visit (INDEPENDENT_AMBULATORY_CARE_PROVIDER_SITE_OTHER): Payer: Medicare Other | Admitting: Family Medicine

## 2017-07-28 VITALS — BP 130/70 | HR 88 | Temp 97.9°F | Resp 14 | Ht 63.0 in | Wt 199.0 lb

## 2017-07-28 DIAGNOSIS — R7982 Elevated C-reactive protein (CRP): Secondary | ICD-10-CM | POA: Diagnosis not present

## 2017-07-28 DIAGNOSIS — E785 Hyperlipidemia, unspecified: Secondary | ICD-10-CM | POA: Diagnosis not present

## 2017-07-28 DIAGNOSIS — Z0000 Encounter for general adult medical examination without abnormal findings: Secondary | ICD-10-CM

## 2017-07-28 DIAGNOSIS — M85852 Other specified disorders of bone density and structure, left thigh: Secondary | ICD-10-CM

## 2017-07-28 DIAGNOSIS — E669 Obesity, unspecified: Secondary | ICD-10-CM | POA: Diagnosis not present

## 2017-07-28 NOTE — Progress Notes (Signed)
Patient: Vanessa Hunter, Female    DOB: 1945/09/19, 71 y.o.   MRN: [id number]  Visit Date: 07/28/2017  Today's Provider: Loistine Chance, MD   Chief Complaint  Patient presents with  . Medicare Wellness     Subjective:    HPI Vanessa Hunter is a 71 y.o. female who presents today for her Subsequent Annual Wellness Visit and follow up  Dyslipidemia; she is 71 yo, mild dyslipidemia, HDL was good, advised to try aspirin but she prefers not to. Not on statin therapy  Elevated c-reactive protein: last year but she never returned to have it rechecked. Denies joint pains or swelling. Feeling well, sleeping well  AR: she she has seasonal allergic rhinitis, she does not like taking pills, Zyrtec made her feel groggy, she uses nasal spray prn. Symptoms are usually post-nasal drainage, a cough and nasal congestion  Obesity: she states she will exercise more, she has gained 12 lbs since last year, she states she has not changed her diet and is not sure why she is gaining weight. She never eats breakfast ( all her life), eating salads and soups, usually cooks at home. She does not eat out a lot  Patient/Caregiver input:  None   Review of Systems  Constitutional: Negative for fever, positive for  weight change.  Respiratory: Negative for cough and shortness of breath.   Cardiovascular: Negative for chest pain or palpitations.  Gastrointestinal: Negative for abdominal pain, no bowel changes.  Musculoskeletal: Negative for gait problem or joint swelling.  Skin: Negative for rash.  Neurological: Negative for dizziness or headache.  No other specific complaints in a complete review of systems (except as listed in HPI above).  Past Medical History:  Diagnosis Date  . Abnormal mammogram   . Allergy   . Hyperlipidemia   . Left shoulder pain   . Osteopenia   . Ovarian failure   . Vaginal atrophy     Past Surgical History:  Procedure Laterality Date  . BREAST BIOPSY Right 03-06-13   RIGHT  BREAST AT 10:30, intramammary lymph node  . BREAST BIOPSY Right    neg  . CHOLECYSTECTOMY    . COLONOSCOPY    . TONSILLECTOMY    . TUBAL LIGATION      Family History  Problem Relation Age of Onset  . Hearing loss Mother   . Migraines Mother   . Heart disease Father   . Heart disease Brother   . Cancer Maternal Aunt        breast  . Breast cancer Maternal Aunt   . Colon cancer Other   . Cancer Other        Colon-Niece  . Hyperlipidemia Sister     Social History   Socioeconomic History  . Marital status: Widowed    Spouse name: Not on file  . Number of children: Not on file  . Years of education: Not on file  . Highest education level: Not on file  Social Needs  . Financial resource strain: Not hard at all  . Food insecurity - worry: Never true  . Food insecurity - inability: Never true  . Transportation needs - medical: No  . Transportation needs - non-medical: No  Occupational History  . Occupation: reitred   Tobacco Use  . Smoking status: Never Smoker  . Smokeless tobacco: Never Used  Substance and Sexual Activity  . Alcohol use: No    Comment: very seldom   . Drug use: No  . Sexual activity:  Yes  Other Topics Concern  . Not on file  Social History Narrative   She is dating Vanessa Hunter since July 2017. They have know each other for a long time, but started  Summer 2017.    Son lives in [place].   She is originally from [place], moved to [place] and after retirement moved with husband to [place], but once her husband died she moved back to [facility].    Mother still living and also sister are here in [place].    She has two grandson's     Outpatient Encounter Medications as of 07/28/2017  Medication Sig  . [DISCONTINUED] amoxicillin-clavulanate (AUGMENTIN) 875-125 MG tablet Take 1 tablet by mouth 2 (two) times daily.   No facility-administered encounter medications on file as of 07/28/2017.     Allergies  Allergen Reactions  . Lac Bovis     lactose intolerance  . Orange Juice  [Orange Oil] Itching    Care Team Updated in EHR: Yes  Last Vision Exam: years ago  Wears corrective lenses: Yes Last Dental Exam: not in a long time Last Hearing Exam: Wears Hearing Aids: No  Functional Ability / Safety Screening 1.  Was the timed Get Up and Go test shorter than 30 seconds?  yes 2.  Does the patient need help with the phone, transportation, shopping,      preparing meals, housework, laundry, medications, or managing money?  no 3.  Is the patient's home free of loose throw rugs in walkways, pet beds, electrical cords, etc?   yes      Grab bars in the bathroom? no      Handrails on the stairs?   yes      Adequate lighting?   yes 4.  Has the patient noticed any hearing difficulties?   no  Diet Recall and Exercise Regimen: yes  Advanced Care Planning:  Patient does have a living will at present time. If patient does have living will, I have requested they bring this to the clinic to be scanned in to their chart. Does patient have a HCPOA?    yes If yes, name and contact information:  Does patient have a living will or MOST form?  yes  Cancer Screenings:  Lung:  Low Dose CT Chest recommended if Age 94-80 years, 30 pack-year currently smoking OR have quit w/in 15years. Patient does not qualify. Breast:  Up to date on Mammogram? Yes  Up to date of Bone Density/Dexa? No Colon: 2014  Additional Screenings:  Hepatitis B/HIV/Syphillis: not interested - dating but not sexually active  Hepatitis C Screening: up to date Intimate Partner Violence: negative screen  Objective:   Vitals: BP 130/70 (BP Location: Left Arm, Patient Position: Sitting, Cuff Size: Normal)   Pulse 88   Temp 97.9 F (36.6 C) (Oral)   Resp 14   Ht 5\' 3"  (1.6 m)   Wt 199 lb (90.3 kg)   SpO2 98%   BMI 35.25 kg/m  Body mass index is 35.25 kg/m.  No exam data present  Physical Exam   Constitutional: Patient appears well-developed and well-nourished. Obese  No distress.  HEENT: head  atraumatic, normocephalic, pupils equal and reactive to light,  neck supple, throat within normal limits Cardiovascular: Normal rate, regular rhythm and normal heart sounds.  No murmur heard. No BLE edema. Pulmonary/Chest: Effort normal and breath sounds normal. No respiratory distress. Breast: normal breast exam Abdominal: Soft.  There is no tenderness. Psychiatric: Patient has a normal mood and affect. behavior is  normal. Judgment and thought content normal.  Cognitive Testing - 6-CIT  Correct? Score   What year is it? yes 0 Yes = 0    No = 4  What month is it? yes 0 Yes = 0    No = 3  Remember:     [address]ia [address]au, [place], [place]     What time is it? yes 0 Yes = 0    No = 3  Count backwards from 20 to 1 yes 0 Correct = 0    1 error = 2   More than 1 error = 4  Say the months of the year in reverse. yes 0 Correct = 0    1 error = 2   More than 1 error = 4  What address did I ask you to remember? yes 0 Correct = 0  1 error = 2    2 error = 4    3 error = 6    4 error = 8    All wrong = 10       TOTAL SCORE  0/28   Interpretation:  Normal  Normal (0-7) Abnormal (8-28)   Fall Risk: Fall Risk  07/28/2017 11/22/2016 07/19/2016 06/10/2015  Falls in the past year? No No No No    Depression Screen Depression screen [facility] 2/9 07/28/2017 11/22/2016 07/19/2016 06/10/2015  Decreased Interest 0 0 0 0  Down, Depressed, Hopeless 0 0 0 0  PHQ - 2 Score 0 0 0 0    No results found for this or any previous visit (from the past 2160 hour(s)).  Assessment & Plan:    1. Medicare annual wellness visit, subsequent  Discussed importance of 150 minutes of physical activity weekly, eat two servings of fish weekly, eat one serving of tree nuts ( cashews, pistachios, pecans, almonds.Marland Kitchen) every other day, eat 6 servings of fruit/vegetables daily and drink plenty of water and avoid sweet beverages.   Refused flu shot Tdap not covered she will return if she has an indication for it  2. Dyslipidemia  -  Lipid panel  3. Elevated C-reactive protein  - COMPLETE METABOLIC PANEL WITH GFR - CBC with Differential/Platelet - C-reactive protein  4. Osteopenia of neck of left femur  - VITAMIN D 25 Hydroxy (Vit-D Deficiency, Fractures) - DG Bone Density; Future   Immunization History  Administered Date(s) Administered  . Pneumococcal Conjugate-13 07/19/2016  . Pneumococcal Polysaccharide-23 01/28/2013    Health Maintenance  Topic Date Due  . TETANUS/TDAP  07/30/1965  . INFLUENZA VACCINE  03/30/2018 (Originally 03/14/2017)  . MAMMOGRAM  08/16/2018  . COLONOSCOPY  02/25/2023  . DEXA SCAN  Completed  . Hepatitis C Screening  Completed  . PNA vac Low Risk Adult  Completed    No orders of the defined types were placed in this encounter.  No current outpatient medications on file. Medications Discontinued During This Encounter  Medication Reason  . amoxicillin-clavulanate (AUGMENTIN) 875-125 MG tablet Error    I have personally reviewed and addressed the Medicare Annual Wellness health risk assessment questionnaire and have noted the following in the patient's chart:  A.         Medical and social history & family history B.         Use of alcohol, tobacco, and illicit drugs  C.         Current medications and supplements D.         Functional and Cognitive ability and status E.  Nutritional status F.         Physical activity G.        Advance directives H.         List of other physicians I.          Hospitalizations, surgeries, and ER visits in previous 12 months J.         [place] such as hearing, vision, cognitive function, and depression L.         Referrals and appointments: bone density  In addition, I have reviewed and discussed with patient certain preventive protocols, quality metrics, and best practice recommendations. A written personalized care plan for preventive services as well as general preventive health recommendations were provided  to patient.  See attached scanned questionnaire for additional information.

## 2017-07-28 NOTE — Patient Instructions (Signed)
Preventive Care 65 Years and Older, Female Preventive care refers to lifestyle choices and visits with your health care provider that can promote health and wellness. What does preventive care include?  A yearly physical exam. This is also called an annual well check.  Dental exams once or twice a year.  Routine eye exams. Ask your health care provider how often you should have your eyes checked.  Personal lifestyle choices, including: ? Daily care of your teeth and gums. ? Regular physical activity. ? Eating a healthy diet. ? Avoiding tobacco and drug use. ? Limiting alcohol use. ? Practicing safe sex. ? Taking low-dose aspirin every day. ? Taking vitamin and mineral supplements as recommended by your health care provider. What happens during an annual well check? The services and screenings done by your health care provider during your annual well check will depend on your age, overall health, lifestyle risk factors, and family history of disease. Counseling Your health care provider may ask you questions about your:  Alcohol use.  Tobacco use.  Drug use.  Emotional well-being.  Home and relationship well-being.  Sexual activity.  Eating habits.  History of falls.  Memory and ability to understand (cognition).  Work and work environment.  Reproductive health.  Screening You may have the following tests or measurements:  Height, weight, and BMI.  Blood pressure.  Lipid and cholesterol levels. These may be checked every 5 years, or more frequently if you are over 50 years old.  Skin check.  Lung cancer screening. You may have this screening every year starting at age 55 if you have a 30-pack-year history of smoking and currently smoke or have quit within the past 15 years.  Fecal occult blood test (FOBT) of the stool. You may have this test every year starting at age 50.  Flexible sigmoidoscopy or colonoscopy. You may have a sigmoidoscopy every 5 years or  a colonoscopy every 10 years starting at age 50.  Hepatitis C blood test.  Hepatitis B blood test.  Sexually transmitted disease (STD) testing.  Diabetes screening. This is done by checking your blood sugar (glucose) after you have not eaten for a while (fasting). You may have this done every 1-3 years.  Bone density scan. This is done to screen for osteoporosis. You may have this done starting at age 65.  Mammogram. This may be done every 1-2 years. Talk to your health care provider about how often you should have regular mammograms.  Talk with your health care provider about your test results, treatment options, and if necessary, the need for more tests. Vaccines Your health care provider may recommend certain vaccines, such as:  Influenza vaccine. This is recommended every year.  Tetanus, diphtheria, and acellular pertussis (Tdap, Td) vaccine. You may need a Td booster every 10 years.  Varicella vaccine. You may need this if you have not been vaccinated.  Zoster vaccine. You may need this after age 60.  Measles, mumps, and rubella (MMR) vaccine. You may need at least one dose of MMR if you were born in 1957 or later. You may also need a second dose.  Pneumococcal 13-valent conjugate (PCV13) vaccine. One dose is recommended after age 65.  Pneumococcal polysaccharide (PPSV23) vaccine. One dose is recommended after age 65.  Meningococcal vaccine. You may need this if you have certain conditions.  Hepatitis A vaccine. You may need this if you have certain conditions or if you travel or work in places where you may be exposed to hepatitis   A.  Hepatitis B vaccine. You may need this if you have certain conditions or if you travel or work in places where you may be exposed to hepatitis B.  Haemophilus influenzae type b (Hib) vaccine. You may need this if you have certain conditions.  Talk to your health care provider about which screenings and vaccines you need and how often you  need them. This information is not intended to replace advice given to you by your health care provider. Make sure you discuss any questions you have with your health care provider. Document Released: 08/27/2015 Document Revised: 04/19/2016 Document Reviewed: 06/01/2015 Elsevier Interactive Patient Education  2017 [employer].

## 2017-08-03 DIAGNOSIS — E785 Hyperlipidemia, unspecified: Secondary | ICD-10-CM | POA: Diagnosis not present

## 2017-08-03 DIAGNOSIS — R7982 Elevated C-reactive protein (CRP): Secondary | ICD-10-CM | POA: Diagnosis not present

## 2017-08-03 DIAGNOSIS — M85852 Other specified disorders of bone density and structure, left thigh: Secondary | ICD-10-CM | POA: Diagnosis not present

## 2017-08-04 LAB — CBC WITH DIFFERENTIAL/PLATELET
BASOS ABS: 51 {cells}/uL (ref 0–200)
Basophils Relative: 0.9 %
EOS PCT: 1.4 %
Eosinophils Absolute: 80 cells/uL (ref 15–500)
HCT: 39.8 % (ref 35.0–45.0)
Hemoglobin: 13.5 g/dL (ref 11.7–15.5)
Lymphs Abs: 2029 cells/uL (ref 850–3900)
MCH: 30.3 pg (ref 27.0–33.0)
MCHC: 33.9 g/dL (ref 32.0–36.0)
MCV: 89.2 fL (ref 80.0–100.0)
MPV: 11.6 fL (ref 7.5–12.5)
Monocytes Relative: 7.2 %
NEUTROS ABS: 3129 {cells}/uL (ref 1500–7800)
NEUTROS PCT: 54.9 %
Platelets: 288 10*3/uL (ref 140–400)
RBC: 4.46 10*6/uL (ref 3.80–5.10)
RDW: 11.7 % (ref 11.0–15.0)
Total Lymphocyte: 35.6 %
WBC mixed population: 410 cells/uL (ref 200–950)
WBC: 5.7 10*3/uL (ref 3.8–10.8)

## 2017-08-04 LAB — COMPLETE METABOLIC PANEL WITH GFR
AG RATIO: 1.1 (calc) (ref 1.0–2.5)
ALKALINE PHOSPHATASE (APISO): 68 U/L (ref 33–130)
ALT: 10 U/L (ref 6–29)
AST: 15 U/L (ref 10–35)
Albumin: 3.9 g/dL (ref 3.6–5.1)
BILIRUBIN TOTAL: 0.4 mg/dL (ref 0.2–1.2)
BUN: 14 mg/dL (ref 7–25)
CHLORIDE: 105 mmol/L (ref 98–110)
CO2: 28 mmol/L (ref 20–32)
Calcium: 9 mg/dL (ref 8.6–10.4)
Creat: 0.67 mg/dL (ref 0.60–0.93)
GFR, EST AFRICAN AMERICAN: 102 mL/min/{1.73_m2} (ref >=60)
GFR, Est Non African American: 88 mL/min/{1.73_m2} (ref >=60)
GLUCOSE: 98 mg/dL (ref 65–99)
Globulin: 3.6 g/dL (calc) (ref 1.9–3.7)
POTASSIUM: 4.1 mmol/L (ref 3.5–5.3)
Sodium: 142 mmol/L (ref 135–146)
TOTAL PROTEIN: 7.5 g/dL (ref 6.1–8.1)

## 2017-08-04 LAB — LIPID PANEL
CHOLESTEROL: 236 mg/dL — AB (ref <200)
HDL: 67 mg/dL (ref >50)
LDL Cholesterol (Calc): 148 mg/dL (calc) — ABNORMAL HIGH
Non-HDL Cholesterol (Calc): 169 mg/dL (calc) — ABNORMAL HIGH (ref <130)
TRIGLYCERIDES: 98 mg/dL (ref <150)
Total CHOL/HDL Ratio: 3.5 (calc) (ref <5.0)

## 2017-08-04 LAB — VITAMIN D 25 HYDROXY (VIT D DEFICIENCY, FRACTURES): Vit D, 25-Hydroxy: 18 ng/mL — ABNORMAL LOW (ref 30–100)

## 2017-08-04 LAB — C-REACTIVE PROTEIN: CRP: 8.5 mg/L — AB (ref <8.0)

## 2017-08-06 ENCOUNTER — Other Ambulatory Visit: Payer: Self-pay | Admitting: Family Medicine

## 2017-08-06 MED ORDER — VITAMIN D (ERGOCALCIFEROL) 1.25 MG (50000 UNIT) PO CAPS: 50000.0000 [IU] | ORAL_CAPSULE | ORAL | 0 refills | Status: DC

## 2017-08-08 ENCOUNTER — Other Ambulatory Visit: Payer: Self-pay | Admitting: Family Medicine

## 2017-08-08 DIAGNOSIS — Z1231 Encounter for screening mammogram for malignant neoplasm of breast: Secondary | ICD-10-CM

## 2017-08-29 ENCOUNTER — Ambulatory Visit
Admission: RE | Admit: 2017-08-29 | Discharge: 2017-08-29 | Disposition: A | Discharge status: Home / Self Care | Payer: Medicare Other | Source: Ambulatory Visit | Attending: Family Medicine | Admitting: Family Medicine

## 2017-08-29 DIAGNOSIS — Z1231 Encounter for screening mammogram for malignant neoplasm of breast: Secondary | ICD-10-CM | POA: Insufficient documentation

## 2017-08-29 DIAGNOSIS — M85852 Other specified disorders of bone density and structure, left thigh: Secondary | ICD-10-CM | POA: Insufficient documentation

## 2017-08-29 DIAGNOSIS — Z780 Asymptomatic menopausal state: Secondary | ICD-10-CM | POA: Diagnosis not present

## 2017-08-29 DIAGNOSIS — M85851 Other specified disorders of bone density and structure, right thigh: Secondary | ICD-10-CM | POA: Diagnosis not present

## 2017-09-07 ENCOUNTER — Encounter: Payer: Self-pay | Admitting: Family Medicine

## 2017-09-07 ENCOUNTER — Ambulatory Visit (INDEPENDENT_AMBULATORY_CARE_PROVIDER_SITE_OTHER): Payer: Medicare Other | Admitting: Family Medicine

## 2017-09-07 VITALS — BP 106/58 | HR 90 | Temp 97.6°F | Resp 18 | Ht 63.0 in | Wt 204.3 lb

## 2017-09-07 DIAGNOSIS — M85852 Other specified disorders of bone density and structure, left thigh: Secondary | ICD-10-CM | POA: Diagnosis not present

## 2017-09-07 DIAGNOSIS — E785 Hyperlipidemia, unspecified: Secondary | ICD-10-CM

## 2017-09-07 DIAGNOSIS — R7982 Elevated C-reactive protein (CRP): Secondary | ICD-10-CM

## 2017-09-07 MED ORDER — ALENDRONATE SODIUM 35 MG PO TABS: 35.0000 mg | ORAL_TABLET | ORAL | 3 refills | Status: DC

## 2017-09-07 MED ORDER — ATORVASTATIN CALCIUM 20 MG PO TABS: 20.0000 mg | ORAL_TABLET | Freq: Every day | ORAL | 1 refills | Status: DC

## 2017-09-07 NOTE — Progress Notes (Signed)
Name: Vanessa Hunter   MRN: [id number]    DOB: [birth year]   Date:09/07/2017       Progress Note  Subjective  Chief Complaint  Chief Complaint  Patient presents with  . Osteopenia  . Bone Density Results    Discuss medication for Osteopenia found on Scan on femur    HPI  Osteopenia: discussed FRAX and discussed options for therapy and diet, life style modification. Patient denies any symptoms of reflux, no joint pains and is able to take Fosamax in am and not recline. She would like to try Alendronate. I also explained the need to take vitamin D rx and have a high calcium diet  Dyslipidemia: she is willing to try medication, no chest pain or palpitation. BP is towards low end of normal, but she denies dizziness.   Elevated C-reactive protein: no joint pains, fever or weight loss, she agrees on recheck labs after statin therapy is started.   Patient Active Problem List   Diagnosis Date Noted  . Elevated C-reactive protein 07/28/2017  . Allergic rhinitis 06/10/2015  . Dyslipidemia 06/10/2015  . Osteopenia 06/10/2015  . Pain in shoulder 06/10/2015  . Atrophy of vagina 06/10/2015  . History of diverticulitis 06/10/2015    Social History   Tobacco Use  . Smoking status: Never Smoker  . Smokeless tobacco: Never Used  Substance Use Topics  . Alcohol use: No    Comment: very seldom      Current Outpatient Medications:  Marland Kitchen  Vitamin D, Ergocalciferol, (DRISDOL) 50000 units CAPS capsule, Take 1 capsule (50,000 Units total) by mouth every 7 (seven) days., Disp: 12 capsule, Rfl: 0  Allergies  Allergen Reactions  . Lac Bovis     lactose intolerance  . Orange Juice [Orange Oil] Itching    ROS  Constitutional: Negative for fever or weight change.  Respiratory: Negative for cough and shortness of breath.   Cardiovascular: Negative for chest pain or palpitations.  Gastrointestinal: Negative for abdominal pain, no bowel changes.  Musculoskeletal: Negative for gait problem or joint  swelling.  Skin: Negative for rash.  Neurological: Negative for dizziness or headache.  No other specific complaints in a complete review of systems (except as listed in HPI above).  Objective  Vitals:   09/07/17 1100  BP: (!) 106/58  Pulse: 90  Resp: 18  Temp: 97.6 F (36.4 C)  TempSrc: Oral  SpO2: 95%  Weight: 204 lb 4.8 oz (92.7 kg)  Height: 5\' 3"  (1.6 m)    Body mass index is 36.19 kg/m.    Physical Exam  Constitutional: Patient appears well-developed and well-nourished. Obese No distress.  HEENT: head atraumatic, normocephalic, pupils equal and reactive to light, neck supple, throat within normal limits Cardiovascular: Normal rate, regular rhythm and normal heart sounds.  No murmur heard. No BLE edema. Pulmonary/Chest: Effort normal and breath sounds normal. No respiratory distress. Abdominal: Soft.  There is no tenderness. Psychiatric: Patient has a normal mood and affect. behavior is normal. Judgment and thought content normal.  Recent Results (from the past 2160 hour(s))  COMPLETE METABOLIC PANEL WITH GFR     Status: None   Collection Time: 08/03/17 11:17 AM  Result Value Ref Range   Glucose, Bld 98 65 - 99 mg/dL    Comment: .            Fasting reference interval .    BUN 14 7 - 25 mg/dL   Creat 0.67 0.60 - 0.93 mg/dL    Comment: For patients >  23 years of age, the reference limit for Creatinine is approximately 13% higher for people identified as African-American. .    GFR, Est Non African American 88 > OR = 60 mL/min/1.44m2   GFR, Est African American 102 > OR = 60 mL/min/1.71m2   BUN/Creatinine Ratio NOT APPLICABLE 6 - 22 (calc)   Sodium 142 135 - 146 mmol/L   Potassium 4.1 3.5 - 5.3 mmol/L   Chloride 105 98 - 110 mmol/L   CO2 28 20 - 32 mmol/L   Calcium 9.0 8.6 - 10.4 mg/dL   Total Protein 7.5 6.1 - 8.1 g/dL   Albumin 3.9 3.6 - 5.1 g/dL   Globulin 3.6 1.9 - 3.7 g/dL (calc)   AG Ratio 1.1 1.0 - 2.5 (calc)   Total Bilirubin 0.4 0.2 - 1.2 mg/dL    Alkaline phosphatase (APISO) 68 33 - 130 U/L   AST 15 10 - 35 U/L   ALT 10 6 - 29 U/L  CBC with Differential/Platelet     Status: None   Collection Time: 08/03/17 11:17 AM  Result Value Ref Range   WBC 5.7 3.8 - 10.8 Thousand/uL   RBC 4.46 3.80 - 5.10 Million/uL   Hemoglobin 13.5 11.7 - 15.5 g/dL   HCT 39.8 35.0 - 45.0 %   MCV 89.2 80.0 - 100.0 fL   MCH 30.3 27.0 - 33.0 pg   MCHC 33.9 32.0 - 36.0 g/dL   RDW 11.7 11.0 - 15.0 %   Platelets 288 140 - 400 Thousand/uL   MPV 11.6 7.5 - 12.5 fL   Neutro Abs 3,129 1,500 - 7,800 cells/uL   Lymphs Abs 2,029 850 - 3,900 cells/uL   WBC mixed population 410 200 - 950 cells/uL   Eosinophils Absolute 80 15 - 500 cells/uL   Basophils Absolute 51 0 - 200 cells/uL   Neutrophils Relative % 54.9 %   Total Lymphocyte 35.6 %   Monocytes Relative 7.2 %   Eosinophils Relative 1.4 %   Basophils Relative 0.9 %  Lipid panel     Status: Abnormal   Collection Time: 08/03/17 11:17 AM  Result Value Ref Range   Cholesterol 236 (H) <200 mg/dL   HDL 67 >50 mg/dL   Triglycerides 98 <150 mg/dL   LDL Cholesterol (Calc) 148 (H) mg/dL (calc)    Comment: Reference range: <100 . Desirable range <100 mg/dL for primary prevention;   <70 mg/dL for patients with CHD or diabetic patients  with > or = 2 CHD risk factors. Marland Kitchen LDL-C is now calculated using the Martin-Hopkins  calculation, which is a validated novel method providing  better accuracy than the Friedewald equation in the  estimation of LDL-C.  Cresenciano Genre et al. Annamaria Helling. 6301;601(09): 2061-2068  (http://education.[facility].com/faq/FAQ164)    Total CHOL/HDL Ratio 3.5 <5.0 (calc)   Non-HDL Cholesterol (Calc) 169 (H) <130 mg/dL (calc)    Comment: For patients with diabetes plus 1 major ASCVD risk  factor, treating to a non-HDL-C goal of <100 mg/dL  (LDL-C of <70 mg/dL) is considered a therapeutic  option.   VITAMIN D 25 Hydroxy (Vit-D Deficiency, Fractures)     Status: Abnormal   Collection Time:  08/03/17 11:17 AM  Result Value Ref Range   Vit D, 25-Hydroxy 18 (L) 30 - 100 ng/mL    Comment: Vitamin D Status         25-OH Vitamin D: . Deficiency:                    <  20 ng/mL Insufficiency:             20 - 29 ng/mL Optimal:                 > or = 30 ng/mL . For 25-OH Vitamin D testing on patients on  D2-supplementation and patients for whom quantitation  of D2 and D3 fractions is required, the [facility]ureD(TM) 25-OH VIT D, (D2,D3), LC/MS/MS is recommended: order  code [phone] (patients >62yrs). . For more information on this test, go to: http://education.[facility].com/faq/FAQ163 (This link is being provided for  informational/educational purposes only.)   C-reactive protein     Status: Abnormal   Collection Time: 08/03/17 11:17 AM  Result Value Ref Range   CRP 8.5 (H) <8.0 mg/L     Assessment & Plan  1. Osteopenia of neck of left femur  - alendronate (FOSAMAX) 35 MG tablet; Take 1 tablet (35 mg total) by mouth every 7 (seven) days. Take with a full glass of water on an empty stomach.  Dispense: 12 tablet; Refill: 3  2. Dyslipidemia  - atorvastatin (LIPITOR) 20 MG tablet; Take 1 tablet (20 mg total) by mouth daily.  Dispense: 90 tablet; Refill: 1  3. Elevated C-reactive protein  Recheck in 6 months after she takes Atorvastatin

## 2018-03-07 ENCOUNTER — Ambulatory Visit (INDEPENDENT_AMBULATORY_CARE_PROVIDER_SITE_OTHER): Payer: Medicare Other | Admitting: Family Medicine

## 2018-03-07 ENCOUNTER — Telehealth: Payer: Self-pay | Admitting: Family Medicine

## 2018-03-07 ENCOUNTER — Other Ambulatory Visit: Payer: Self-pay | Admitting: Family Medicine

## 2018-03-07 ENCOUNTER — Encounter: Payer: Self-pay | Admitting: Family Medicine

## 2018-03-07 VITALS — BP 134/72 | HR 78 | Temp 98.0°F | Resp 18 | Ht 63.0 in | Wt 205.3 lb

## 2018-03-07 DIAGNOSIS — R635 Abnormal weight gain: Secondary | ICD-10-CM | POA: Diagnosis not present

## 2018-03-07 DIAGNOSIS — D692 Other nonthrombocytopenic purpura: Secondary | ICD-10-CM

## 2018-03-07 DIAGNOSIS — E559 Vitamin D deficiency, unspecified: Secondary | ICD-10-CM

## 2018-03-07 DIAGNOSIS — E669 Obesity, unspecified: Secondary | ICD-10-CM

## 2018-03-07 DIAGNOSIS — R7982 Elevated C-reactive protein (CRP): Secondary | ICD-10-CM | POA: Diagnosis not present

## 2018-03-07 DIAGNOSIS — E785 Hyperlipidemia, unspecified: Secondary | ICD-10-CM | POA: Diagnosis not present

## 2018-03-07 DIAGNOSIS — M85852 Other specified disorders of bone density and structure, left thigh: Secondary | ICD-10-CM | POA: Diagnosis not present

## 2018-03-07 MED ORDER — ATORVASTATIN CALCIUM 40 MG PO TABS: 40.0000 mg | ORAL_TABLET | Freq: Every day | ORAL | 1 refills | Status: DC

## 2018-03-07 MED ORDER — ROSUVASTATIN CALCIUM 10 MG PO TABS: 10.0000 mg | ORAL_TABLET | Freq: Every day | ORAL | 1 refills | Status: DC

## 2018-03-07 MED ORDER — ALENDRONATE SODIUM 35 MG PO TABS: 35.0000 mg | ORAL_TABLET | ORAL | 3 refills | Status: DC

## 2018-03-07 MED ORDER — VITAMIN D (ERGOCALCIFEROL) 1.25 MG (50000 UNIT) PO CAPS: 50000.0000 [IU] | ORAL_CAPSULE | ORAL | 0 refills | Status: DC

## 2018-03-07 NOTE — Telephone Encounter (Signed)
PT is at pharm and Crestor is to expensive 41.00 month and would like to go back to her lipitor for it is 10.00 cheaper a month. Pharm says wants to go back to Lipitor due to cost even thought it gave her issues.

## 2018-03-07 NOTE — Progress Notes (Signed)
Name: Vanessa Hunter   MRN: [id number]    DOB: 1946-02-08   Date:03/07/2018       Progress Note  Subjective  Chief Complaint  Chief Complaint  Patient presents with  . Medication Refill    6 month F/U  . Osteopenia of neck of left femur    Took all of the Fosamax that was prescribed.   . Dyslipidemia    Atorvastatin Medication was causing weight gain and stopped it a month ago    HPI   Osteopenia: discussed FRAX and discussed options for therapy and diet, life style modification. Patient denies any symptoms of reflux, no joint pains and is able to take Fosamax, she also had low vitamin D level. She stopped taking all medications. Forgot to get otc vitamin D and states no refills left of Fosamax. I sent one year supply and will re-send it today. Explained she will take it long term  Dyslipidemia: she took Atorvastatin for 3 months but she gained weight and developed nocturia. She stopped medication on her own. Explained that weight gain is not a common side effects of medication, also explained that with elevated CRP I do recommend statin therapy to decrease inflammation. She is willing to try Crestor now  Elevated C-reactive protein: no joint pains, fever or weight loss, we will recheck it  Obesity: she has gained some weight, BMI above 35. She has been physically active, sometimes skips lunch.   Senile purpura: she states she has easy bruising on arms.   Patient Active Problem List   Diagnosis Date Noted  . Senile purpura ([place]) 03/07/2018  . Elevated C-reactive protein 07/28/2017  . Allergic rhinitis 06/10/2015  . Dyslipidemia 06/10/2015  . Osteopenia 06/10/2015  . Pain in shoulder 06/10/2015  . Atrophy of vagina 06/10/2015  . History of diverticulitis 06/10/2015    Past Surgical History:  Procedure Laterality Date  . BREAST BIOPSY Right 03-06-13   RIGHT BREAST AT 10:30, intramammary lymph node  . BREAST BIOPSY Right    neg  . CHOLECYSTECTOMY    . COLONOSCOPY    .  TONSILLECTOMY    . TUBAL LIGATION      Family History  Problem Relation Age of Onset  . Hearing loss Mother   . Migraines Mother   . Heart disease Father   . Heart disease Brother   . Cancer Maternal Aunt        breast  . Breast cancer Maternal Aunt   . Colon cancer Other   . Cancer Other        Colon-Niece  . Hyperlipidemia Sister     Social History   Socioeconomic History  . Marital status: Widowed    Spouse name: Not on file  . Number of children: Not on file  . Years of education: Not on file  . Highest education level: Not on file  Occupational History  . 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 Financial resource strain: Not hard at all  . Food insecurity:    Worry: Never true    Inability: Never true  . Transportation needs:    Medical: No    Non-medical: No  Tobacco Use  . Smoking status: Never Smoker  . Smokeless tobacco: Never Used  Substance and Sexual Activity  . Alcohol use: No    Comment: very seldom   . Drug use: No  . Sexual activity: Yes  Lifestyle  . Physical activity:  Days per week: 4 days    Minutes per session: 30 min  . Stress: Not at all  Relationships  . Social connections:    Talks on phone: More than three times a week    Gets together: More than three times a week    Attends religious service: More than 4 times per year    Active member of club or organization: Yes    Attends meetings of clubs or organizations: More than 4 times per year    Relationship status: Widowed  . Intimate partner violence:    Fear of current or ex partner: No    Emotionally abused: No    Physically abused: No    Forced sexual activity: No  Other Topics Concern  . Not on file  Social History Narrative   She is dating Berneta Sages since July 2017. They have know each other for a long time, but started  Summer 2017.    Son lives in [place].   She is originally from [place], moved to [place] and after retirement moved with  husband to [place], but once her husband died she moved back to [facility].    Mother still living and also sister are here in [place].    She has two grandson's      Current Outpatient Medications:  .  alendronate (FOSAMAX) 35 MG tablet, Take 1 tablet (35 mg total) by mouth every 7 (seven) days. Take with a full glass of water on an empty stomach., Disp: 12 tablet, Rfl: 3 .  rosuvastatin (CRESTOR) 10 MG tablet, Take 1 tablet (10 mg total) by mouth daily., Disp: 90 tablet, Rfl: 1 .  Vitamin D, Ergocalciferol, (DRISDOL) 50000 units CAPS capsule, Take 1 capsule (50,000 Units total) by mouth every 7 (seven) days., Disp: 12 capsule, Rfl: 0  Allergies  Allergen Reactions  . Lac Bovis     lactose intolerance  . Orange Juice [Orange Oil] Itching     ROS  Constitutional: Negative for fever or significant  weight change.  Respiratory: Negative for cough and shortness of breath.   Cardiovascular: Negative for chest pain or palpitations.  Gastrointestinal: Negative for abdominal pain, no bowel changes.  Musculoskeletal: Negative for gait problem or joint swelling.  Skin: Negative for rash.  Neurological: Negative for dizziness or headache.  No other specific complaints in a complete review of systems (except as listed in HPI above).  Objective  Vitals:   03/07/18 0936  BP: 134/72  Pulse: 78  Resp: 18  Temp: 98 F (36.7 C)  TempSrc: Oral  SpO2: 94%  Weight: 205 lb 4.8 oz (93.1 kg)  Height: 5\' 3"  (1.6 m)    Body mass index is 36.37 kg/m.  Physical Exam  Constitutional: Patient appears well-developed and well-nourished. Obese  No distress.  HEENT: head atraumatic, normocephalic, pupils equal and reactive to light,  neck supple, throat within normal limits Cardiovascular: Normal rate, regular rhythm and normal heart sounds.  No murmur heard. No BLE edema. Pulmonary/Chest: Effort normal and breath sounds normal. No respiratory distress. Abdominal: Soft.  There is no  tenderness. Psychiatric: Patient has a normal mood and affect. behavior is normal. Judgment and thought content normal.  PHQ2/9: Depression screen [facility] 2/9 03/07/2018 07/28/2017 11/22/2016 07/19/2016 06/10/2015  Decreased Interest 0 0 0 0 0  Down, Depressed, Hopeless 0 0 0 0 0  PHQ - 2 Score 0 0 0 0 0    Fall Risk: Fall Risk  03/07/2018 07/28/2017 11/22/2016 07/19/2016 06/10/2015  Falls in the  past year? No No No No No    Functional Status Survey: Is the patient deaf or have difficulty hearing?: No Does the patient have difficulty seeing, even when wearing glasses/contacts?: No Does the patient have difficulty concentrating, remembering, or making decisions?: No Does the patient have difficulty walking or climbing stairs?: No Does the patient have difficulty dressing or bathing?: No Does the patient have difficulty doing errands alone such as visiting a doctor's office or shopping?: No    Assessment & Plan  1. Dyslipidemia  - COMPLETE METABOLIC PANEL WITH GFR - Lipid panel - rosuvastatin (CRESTOR) 10 MG tablet; Take 1 tablet (10 mg total) by mouth daily.  Dispense: 90 tablet; Refill: 1  2. Osteopenia of neck of left femur  - alendronate (FOSAMAX) 35 MG tablet; Take 1 tablet (35 mg total) by mouth every 7 (seven) days. Take with a full glass of water on an empty stomach.  Dispense: 12 tablet; Refill: 3  3. Senile purpura ([facility])  - CBC with Differential/Platelet  4. Elevated C-reactive protein  - C-reactive protein  5. Obesity (BMI 35.0-39.9 without comorbidity)  Discussed with the patient the risk posed by an increased BMI. Discussed importance of portion control, calorie counting and at least 150 minutes of physical activity weekly. Avoid sweet beverages and drink more water. Eat at least 6 servings of fruit and vegetables daily   6. Weight gain  - TSH  7. Vitamin D deficiency  - Vitamin D, Ergocalciferol, (DRISDOL) 50000 units CAPS capsule; Take 1 capsule (50,000 Units  total) by mouth every 7 (seven) days.  Dispense: 12 capsule; Refill: 0

## 2018-06-11 ENCOUNTER — Other Ambulatory Visit: Payer: Self-pay | Admitting: Family Medicine

## 2018-06-11 DIAGNOSIS — E559 Vitamin D deficiency, unspecified: Secondary | ICD-10-CM

## 2018-09-13 ENCOUNTER — Ambulatory Visit (INDEPENDENT_AMBULATORY_CARE_PROVIDER_SITE_OTHER): Payer: Medicare Other

## 2018-09-13 VITALS — BP 114/82 | HR 73 | Temp 97.6°F | Resp 15 | Ht 63.0 in | Wt 206.6 lb

## 2018-09-13 DIAGNOSIS — Z1231 Encounter for screening mammogram for malignant neoplasm of breast: Secondary | ICD-10-CM | POA: Diagnosis not present

## 2018-09-13 DIAGNOSIS — Z0000 Encounter for general adult medical examination without abnormal findings: Secondary | ICD-10-CM

## 2018-09-13 NOTE — Progress Notes (Signed)
Subjective:   Vanessa Hunter is a 73 y.o. female who presents for Medicare Annual (Subsequent) preventive examination.  Review of Systems:   Cardiac Risk Factors include: advanced age (>28men, >76 women);dyslipidemia;obesity (BMI >30kg/m2)     Objective:     Vitals: BP 114/82 (BP Location: Right Arm, Patient Position: Sitting, Cuff Size: Large)   Pulse 73   Temp 97.6 F (36.4 C) (Oral)   Resp 15   Ht 5\' 3"  (1.6 m)   Wt 206 lb 9.6 oz (93.7 kg)   SpO2 98%   BMI 36.60 kg/m   Body mass index is 36.6 kg/m.  Advanced Directives 09/13/2018 11/22/2016 07/19/2016 06/10/2015  Does Patient Have a Medical Advance Directive? Yes Yes Yes Yes  Type of Advance Directive Living will Living will;Healthcare Power of Attorney Living will [place];Living will  Copy of [place] in Chart? - No - copy requested - No - copy requested    Tobacco Social History   Tobacco Use  Smoking Status Never Smoker  Smokeless Tobacco Never Used     Counseling given: Not Answered   Clinical Intake:  Pre-visit preparation completed: Yes  Pain : 0-10 Pain Score: 9  Pain Type: Acute pain Pain Location: Shoulder Pain Orientation: Right Pain Descriptors / Indicators: Aching Pain Onset: 1 to 4 weeks ago Pain Frequency: Intermittent Pain Relieving Factors: tylenol arthritis pain  Pain Relieving Factors: tylenol arthritis pain  Nutritional Status: BMI > 30  Obese Nutritional Risks: None Diabetes: No  How often do you need to have someone help you when you read instructions, pamphlets, or other written materials from your doctor or pharmacy?: 1 - Never What is the last grade level you completed in school?: bachelors degree  Interpreter Needed?: No  Information entered by :: Clemetine Marker LPN  Past Medical History:  Diagnosis Date  . Abnormal mammogram   . Allergy   . Hyperlipidemia   . Left shoulder pain   . Osteopenia   . Ovarian failure   . Vaginal  atrophy    Past Surgical History:  Procedure Laterality Date  . BREAST BIOPSY Right 03-06-13   RIGHT BREAST AT 10:30, intramammary lymph node  . BREAST BIOPSY Right    neg  . CHOLECYSTECTOMY    . COLONOSCOPY    . TONSILLECTOMY    . TUBAL LIGATION     Family History  Problem Relation Age of Onset  . Hearing loss Mother   . Migraines Mother   . Heart disease Father   . Heart disease Brother   . Cancer Maternal Aunt        breast  . Breast cancer Maternal Aunt   . Colon cancer Other   . Cancer Other        Colon-Niece  . Hyperlipidemia Sister    Social History   Socioeconomic History  . Marital status: Widowed    Spouse name: Not on file  . Number of children: 1  . Years of education: Not on file  . Highest education level: Bachelor's degree (e.g., BA, AB, BS)  Occupational History  . 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 Financial resource strain: Not hard at all  . Food insecurity:    Worry: Never true    Inability: Never true  . Transportation needs:    Medical: No    Non-medical: No  Tobacco Use  . Smoking status: Never Smoker  .  Smokeless tobacco: Never Used  Substance and Sexual Activity  . Alcohol use: No    Comment: very seldom   . Drug use: No  . Sexual activity: Yes  Lifestyle  . Physical activity:    Days per week: 0 days    Minutes per session: 0 min  . Stress: To some extent  Relationships  . Social connections:    Talks on phone: More than three times a week    Gets together: More than three times a week    Attends religious service: More than 4 times per year    Active member of club or organization: Yes    Attends meetings of clubs or organizations: More than 4 times per year    Relationship status: Widowed  Other Topics Concern  . Not on file  Social History Narrative   She is dating Berneta Sages since July 2017. They have know each other for a long time, but started  Summer 2017.    Son  lives in [place].   She is originally from [place], moved to [place] and after retirement moved with husband to [place], but once her husband died in 2009/11/18 she moved back to [place] in Nov 19, 2011   Mother still living and also sister are here in [place].    She has two grandson's     Outpatient Encounter Medications as of 09/13/2018  Medication Sig  . alendronate (FOSAMAX) 35 MG tablet Take 1 tablet (35 mg total) by mouth every 7 (seven) days. Take with a full glass of water on an empty stomach.  Marland Kitchen atorvastatin (LIPITOR) 40 MG tablet Take 1 tablet (40 mg total) by mouth daily.  . Vitamin D, Ergocalciferol, (DRISDOL) 50000 units CAPS capsule Take 1 capsule (50,000 Units total) by mouth every 7 (seven) days.   No facility-administered encounter medications on file as of 09/13/2018.     Activities of Daily Living In your present state of health, do you have any difficulty performing the following activities: 09/13/2018 03/07/2018  Hearing? N N  Comment declines hearing aids -  Vision? N N  Difficulty concentrating or making decisions? N N  Walking or climbing stairs? N N  Dressing or bathing? N N  Doing errands, shopping? N N  Preparing Food and eating ? N -  Using the Toilet? N -  In the past six months, have you accidently leaked urine? Y -  Comment wears pads for protections for occasional stress incontinence -  Do you have problems with loss of bowel control? N -  Managing your Medications? N -  Managing your Finances? N -  Housekeeping or managing your Housekeeping? N -  Some recent data might be hidden    Patient Care Team: Steele Sizer, MD as PCP - General (Family Medicine) Bary Castilla, Forest Gleason, MD (General Surgery) Steele Sizer, MD as Attending Physician (Family Medicine)    Assessment:   This is a routine wellness examination for [facility].  Exercise Activities and Dietary recommendations Current Exercise Habits: The patient does not participate in regular exercise at present  Goals    . Weight  (lb) < 200 lb (90.7 kg)     Pt would like to lose weight over the next year        Fall Risk Fall Risk  09/13/2018 03/07/2018 07/28/2017 11/22/2016 07/19/2016  Falls in the past year? 0 No No No No  Number falls in past yr: 0 - - - -  Injury with Fall? 0 - - - -  Follow up Falls prevention discussed - - - -   FALL RISK PREVENTION PERTAINING TO THE HOME:  Any stairs in or around the home WITH handrails? Yes  Home free of loose throw rugs in walkways, pet beds, electrical cords, etc? Yes  Adequate lighting in your home to reduce risk of falls? Yes   ASSISTIVE DEVICES UTILIZED TO PREVENT FALLS:  Life alert? No  Use of a cane, walker or w/c? No  Grab bars in the bathroom? No  Shower chair or bench in shower? No  Elevated toilet seat or a handicapped toilet? No   DME ORDERS:  DME order needed?  No   TIMED UP AND GO:  Was the test performed? Yes .  Length of time to ambulate 10 feet: 5 sec.   GAIT:  Appearance of gait: Gait stead-fast and without the use of an assistive device.  Education: Fall risk prevention has been discussed.  Intervention(s) required? No   Depression Screen PHQ 2/9 Scores 09/13/2018 03/07/2018 07/28/2017 11/22/2016  PHQ - 2 Score 0 0 0 0     Cognitive Function     6CIT Screen 09/13/2018  What Year? 0 points  What month? 0 points  What time? 0 points  Count back from 20 0 points  Months in reverse 0 points  Repeat phrase 2 points  Total Score 2    Immunization History  Administered Date(s) Administered  . Pneumococcal Conjugate-13 07/19/2016  . Pneumococcal Polysaccharide-23 01/28/2013    Qualifies for Shingles Vaccine? Yes . Due for Shingrix. Education has been provided regarding the importance of this vaccine. Pt has been advised to call insurance company to determine out of pocket expense. Advised may also receive vaccine at local pharmacy or Health Dept. Verbalized acceptance and understanding.  Tdap: Although this vaccine is not a  covered service during a Wellness Exam, does the patient still wish to receive this vaccine today?  No .  Education has been provided regarding the importance of this vaccine. Advised may receive this vaccine at local pharmacy or Health Dept. Aware to provide a copy of the vaccination record if obtained from local pharmacy or Health Dept. Verbalized acceptance and understanding.  Flu Vaccine: Due for Flu vaccine. Does the patient want to receive this vaccine today?  No . Education has been provided regarding the importance of this vaccine but still declined. Advised may receive this vaccine at local pharmacy or Health Dept. Aware to provide a copy of the vaccination record if obtained from local pharmacy or Health Dept. Verbalized acceptance and understanding.  Pneumococcal Vaccine: Up to date  Screening Tests Health Maintenance  Topic Date Due  . INFLUENZA VACCINE  11/12/2018 (Originally 03/14/2018)  . TETANUS/TDAP  08/15/2019 (Originally 07/30/1965)  . MAMMOGRAM  08/30/2019  . COLONOSCOPY  02/25/2023  . DEXA SCAN  Completed  . Hepatitis C Screening  Completed  . PNA vac Low Risk Adult  Completed    Cancer Screenings:  Colorectal Screening: Completed 02/24/13. Repeat every 10 years  Mammogram: Completed 08/29/17. Repeat every year; Ordered today. Pt provided with contact information and advised to call to schedule appt.   Bone Density: Completed 08/29/17. Results reflect OSTEOPENIA. Repeat every 2 years.   Lung Cancer Screening: (Low Dose CT Chest recommended if Age 50-80 years, 30 pack-year currently smoking OR have quit w/in 15years.) does not qualify.    Additional Screening:  Hepatitis C Screening: does qualify; Completed 01/28/13  Vision Screening: Recommended annual ophthalmology exams for early detection of glaucoma and  other disorders of the eye. Is the patient up to date with their annual eye exam?  No  Who is the provider or what is the name of the office in which the pt  attends annual eye exams? No provider If pt is not established with a provider, would they like to be referred to a provider to establish care? No .   Dental Screening: Recommended annual dental exams for proper oral hygiene  Community Resource Referral:  CRR required this visit?  No      Plan:    I have personally reviewed and addressed the Medicare Annual Wellness questionnaire and have noted the following in the patient's chart:  A. Medical and social history B. Use of alcohol, tobacco or illicit drugs  C. Current medications and supplements D. Functional ability and status E.  Nutritional status F.  Physical activity G. Advance directives H. List of other physicians I.  Hospitalizations, surgeries, and ER visits in previous 12 months J.  [place] such as hearing and vision if needed, cognitive and depression L. Referrals and appointments   In addition, I have reviewed and discussed with patient certain preventive protocols, quality metrics, and best practice recommendations. A written personalized care plan for preventive services as well as general preventive health recommendations were provided to patient.   Signed,  Clemetine Marker, LPN Nurse Health Advisor   Nurse Notes: Pt due for labs from visit in July 2019. She did not return to complete. Pt was not fasting today but plans to return soon for lab work.

## 2018-09-13 NOTE — Patient Instructions (Signed)
[name] , Thank you for taking time to come for your Medicare Wellness Visit. I appreciate your ongoing commitment to your health goals. Please review the following plan we discussed and let me know if I can assist you in the future.   Screening recommendations/referrals: Colonoscopy: completed 02/24/13. Repeat in 2024.  Mammogram: completed 08/29/17. Please call [phone] to schedule your mammogram.   Bone Density: completed 08/29/17. Repeat in 2021. Recommended yearly ophthalmology/optometry visit for glaucoma screening and checkup Recommended yearly dental visit for hygiene and checkup  Vaccinations: Influenza vaccine: postponed Pneumococcal vaccine: done 07/19/16 Tdap vaccine: due - please contact us if you get a cut or scrape Shingles vaccine: Shingrix discussed. Please contact your pharmacy for coverage information.     Advanced directives: Please bring a copy of your health care power of attorney and living will to the office at your convenience.  Conditions/risks identified: Recommend continuing healthy eating and physical activity for desired weight loss.   Next appointment: Please follow up in one year for your Medicare Annual Wellness visit.     Preventive Care 25 Years and Older, Female Preventive care refers to lifestyle choices and visits with your health care provider that can promote health and wellness. What does preventive care include?  A yearly physical exam. This is also called an annual well check.  Dental exams once or twice a year.  Routine eye exams. Ask your health care provider how often you should have your eyes checked.  Personal lifestyle choices, including:  Daily care of your teeth and gums.  Regular physical activity.  Eating a healthy diet.  Avoiding tobacco and drug use.  Limiting alcohol use.  Practicing safe sex.  Taking low-dose aspirin every day.  Taking vitamin and mineral supplements as recommended by your health care  provider. What happens during an annual well check? The services and screenings done by your health care provider during your annual well check will depend on your age, overall health, lifestyle risk factors, and family history of disease. Counseling  Your health care provider may ask you questions about your:  Alcohol use.  Tobacco use.  Drug use.  Emotional well-being.  Home and relationship well-being.  Sexual activity.  Eating habits.  History of falls.  Memory and ability to understand (cognition).  Work and work Statistician.  Reproductive health. Screening  You may have the following tests or measurements:  Height, weight, and BMI.  Blood pressure.  Lipid and cholesterol levels. These may be checked every 5 years, or more frequently if you are over 1 years old.  Skin check.  Lung cancer screening. You may have this screening every year starting at age 73 if you have a 30-pack-year history of smoking and currently smoke or have quit within the past 15 years.  Fecal occult blood test (FOBT) of the stool. You may have this test every year starting at age 92.  Flexible sigmoidoscopy or colonoscopy. You may have a sigmoidoscopy every 5 years or a colonoscopy every 10 years starting at age 36.  Hepatitis C blood test.  Hepatitis B blood test.  Sexually transmitted disease (STD) testing.  Diabetes screening. This is done by checking your blood sugar (glucose) after you have not eaten for a while (fasting). You may have this done every 1-3 years.  Bone density scan. This is done to screen for osteoporosis. You may have this done starting at age 20.  Mammogram. This may be done every 1-2 years. Talk to your health care provider  about how often you should have regular mammograms. Talk with your health care provider about your test results, treatment options, and if necessary, the need for more tests. Vaccines  Your health care provider may recommend certain  vaccines, such as:  Influenza vaccine. This is recommended every year.  Tetanus, diphtheria, and acellular pertussis (Tdap, Td) vaccine. You may need a Td booster every 10 years.  Zoster vaccine. You may need this after age 46.  Pneumococcal 13-valent conjugate (PCV13) vaccine. One dose is recommended after age 37.  Pneumococcal polysaccharide (PPSV23) vaccine. One dose is recommended after age 69. Talk to your health care provider about which screenings and vaccines you need and how often you need them. This information is not intended to replace advice given to you by your health care provider. Make sure you discuss any questions you have with your health care provider. Document Released: 08/27/2015 Document Revised: 04/19/2016 Document Reviewed: 06/01/2015 Elsevier Interactive Patient Education  2017 [employer] Prevention in the Home Falls can cause injuries. They can happen to people of all ages. There are many things you can do to make your home safe and to help prevent falls. What can I do on the outside of my home?  Regularly fix the edges of walkways and driveways and fix any cracks.  Remove anything that might make you trip as you walk through a door, such as a raised step or threshold.  Trim any bushes or trees on the path to your home.  Use bright outdoor lighting.  Clear any walking paths of anything that might make someone trip, such as rocks or tools.  Regularly check to see if handrails are loose or broken. Make sure that both sides of any steps have handrails.  Any raised decks and porches should have guardrails on the edges.  Have any leaves, snow, or ice cleared regularly.  Use sand or salt on walking paths during winter.  Clean up any spills in your garage right away. This includes oil or grease spills. What can I do in the bathroom?  Use night lights.  Install grab bars by the toilet and in the tub and shower. Do not use towel bars as grab  bars.  Use non-skid mats or decals in the tub or shower.  If you need to sit down in the shower, use a plastic, non-slip stool.  Keep the floor dry. Clean up any water that spills on the floor as soon as it happens.  Remove soap buildup in the tub or shower regularly.  Attach bath mats securely with double-sided non-slip rug tape.  Do not have throw rugs and other things on the floor that can make you trip. What can I do in the bedroom?  Use night lights.  Make sure that you have a light by your bed that is easy to reach.  Do not use any sheets or blankets that are too big for your bed. They should not hang down onto the floor.  Have a firm chair that has side arms. You can use this for support while you get dressed.  Do not have throw rugs and other things on the floor that can make you trip. What can I do in the kitchen?  Clean up any spills right away.  Avoid walking on wet floors.  Keep items that you use a lot in easy-to-reach places.  If you need to reach something above you, use a strong step stool that has a grab bar.  Keep electrical cords out of the way.  Do not use floor polish or wax that makes floors slippery. If you must use wax, use non-skid floor wax.  Do not have throw rugs and other things on the floor that can make you trip. What can I do with my stairs?  Do not leave any items on the stairs.  Make sure that there are handrails on both sides of the stairs and use them. Fix handrails that are broken or loose. Make sure that handrails are as long as the stairways.  Check any carpeting to make sure that it is firmly attached to the stairs. Fix any carpet that is loose or worn.  Avoid having throw rugs at the top or bottom of the stairs. If you do have throw rugs, attach them to the floor with carpet tape.  Make sure that you have a light switch at the top of the stairs and the bottom of the stairs. If you do not have them, ask someone to add them for  you. What else can I do to help prevent falls?  Wear shoes that:  Do not have high heels.  Have rubber bottoms.  Are comfortable and fit you well.  Are closed at the toe. Do not wear sandals.  If you use a stepladder:  Make sure that it is fully opened. Do not climb a closed stepladder.  Make sure that both sides of the stepladder are locked into place.  Ask someone to hold it for you, if possible.  Clearly mark and make sure that you can see:  Any grab bars or handrails.  First and last steps.  Where the edge of each step is.  Use tools that help you move around (mobility aids) if they are needed. These include:  Canes.  Walkers.  Scooters.  Crutches.  Turn on the lights when you go into a dark area. Replace any light bulbs as soon as they burn out.  Set up your furniture so you have a clear path. Avoid moving your furniture around.  If any of your floors are uneven, fix them.  If there are any pets around you, be aware of where they are.  Review your medicines with your doctor. Some medicines can make you feel dizzy. This can increase your chance of falling. Ask your doctor what other things that you can do to help prevent falls. This information is not intended to replace advice given to you by your health care provider. Make sure you discuss any questions you have with your health care provider. Document Released: 05/27/2009 Document Revised: 01/06/2016 Document Reviewed: 09/04/2014 Elsevier Interactive Patient Education  2017 [employer].

## 2018-09-23 DIAGNOSIS — D692 Other nonthrombocytopenic purpura: Secondary | ICD-10-CM | POA: Diagnosis not present

## 2018-09-23 DIAGNOSIS — R7982 Elevated C-reactive protein (CRP): Secondary | ICD-10-CM | POA: Diagnosis not present

## 2018-09-23 DIAGNOSIS — E785 Hyperlipidemia, unspecified: Secondary | ICD-10-CM | POA: Diagnosis not present

## 2018-09-23 DIAGNOSIS — R635 Abnormal weight gain: Secondary | ICD-10-CM | POA: Diagnosis not present

## 2018-09-24 LAB — CBC WITH DIFFERENTIAL/PLATELET
Absolute Monocytes: 360 cells/uL (ref 200–950)
BASOS ABS: 58 {cells}/uL (ref 0–200)
Basophils Relative: 1.1 %
EOS ABS: 58 {cells}/uL (ref 15–500)
EOS PCT: 1.1 %
HCT: 40.2 % (ref 35.0–45.0)
HEMOGLOBIN: 13.6 g/dL (ref 11.7–15.5)
Lymphs Abs: 1717 cells/uL (ref 850–3900)
MCH: 30.4 pg (ref 27.0–33.0)
MCHC: 33.8 g/dL (ref 32.0–36.0)
MCV: 89.9 fL (ref 80.0–100.0)
MONOS PCT: 6.8 %
MPV: 12.1 fL (ref 7.5–12.5)
NEUTROS ABS: 3106 {cells}/uL (ref 1500–7800)
Neutrophils Relative %: 58.6 %
PLATELETS: 274 10*3/uL (ref 140–400)
RBC: 4.47 10*6/uL (ref 3.80–5.10)
RDW: 11.9 % (ref 11.0–15.0)
TOTAL LYMPHOCYTE: 32.4 %
WBC: 5.3 10*3/uL (ref 3.8–10.8)

## 2018-09-24 LAB — COMPLETE METABOLIC PANEL WITH GFR
AG Ratio: 1.2 (calc) (ref 1.0–2.5)
ALBUMIN MSPROF: 4.2 g/dL (ref 3.6–5.1)
ALKALINE PHOSPHATASE (APISO): 72 U/L (ref 37–153)
ALT: 11 U/L (ref 6–29)
AST: 21 U/L (ref 10–35)
BILIRUBIN TOTAL: 0.5 mg/dL (ref 0.2–1.2)
BUN: 10 mg/dL (ref 7–25)
CHLORIDE: 103 mmol/L (ref 98–110)
CO2: 30 mmol/L (ref 20–32)
Calcium: 9.2 mg/dL (ref 8.6–10.4)
Creat: 0.79 mg/dL (ref 0.60–0.93)
GFR, Est African American: 87 mL/min/{1.73_m2} (ref >=60)
GFR, Est Non African American: 75 mL/min/{1.73_m2} (ref >=60)
GLOBULIN: 3.5 g/dL (ref 1.9–3.7)
GLUCOSE: 99 mg/dL (ref 65–99)
Potassium: 3.5 mmol/L (ref 3.5–5.3)
SODIUM: 143 mmol/L (ref 135–146)
Total Protein: 7.7 g/dL (ref 6.1–8.1)

## 2018-09-24 LAB — LIPID PANEL
CHOLESTEROL: 202 mg/dL — AB (ref <200)
HDL: 63 mg/dL (ref >=50)
LDL Cholesterol (Calc): 120 mg/dL (calc) — ABNORMAL HIGH
Non-HDL Cholesterol (Calc): 139 mg/dL (calc) — ABNORMAL HIGH (ref <130)
Total CHOL/HDL Ratio: 3.2 (calc) (ref <5.0)
Triglycerides: 87 mg/dL (ref <150)

## 2018-09-24 LAB — TSH: TSH: 1.97 m[IU]/L (ref 0.40–4.50)

## 2018-09-24 LAB — C-REACTIVE PROTEIN: CRP: 10 mg/L — ABNORMAL HIGH (ref <8.0)

## 2018-09-25 ENCOUNTER — Other Ambulatory Visit: Payer: Self-pay | Admitting: Family Medicine

## 2018-09-25 MED ORDER — ROSUVASTATIN CALCIUM 40 MG PO TABS: 40.0000 mg | ORAL_TABLET | Freq: Every day | ORAL | 1 refills | Status: DC

## 2018-10-02 ENCOUNTER — Ambulatory Visit
Admission: RE | Admit: 2018-10-02 | Discharge: 2018-10-02 | Disposition: A | Discharge status: Home / Self Care | Payer: Medicare Other | Source: Ambulatory Visit | Attending: Family Medicine | Admitting: Family Medicine

## 2018-10-02 DIAGNOSIS — Z1231 Encounter for screening mammogram for malignant neoplasm of breast: Secondary | ICD-10-CM | POA: Insufficient documentation

## 2018-11-07 ENCOUNTER — Ambulatory Visit: Payer: Self-pay | Admitting: *Deleted

## 2018-11-07 NOTE — Telephone Encounter (Signed)
Summary: cold sores   Pt called and left voicemail in [place] on 11/06/2018. States that she woke with two cold sores, one near nose and one on lip and she would like to know what to do about them.     Call to patient- patient is reporting that she has had recurrence of cold sore on face- on below bottom lip and one beside left nares.( Patient reports the cold sore at nares she has had before.) Patient is treating with over the counter medication and they are drying and healing. She was unable to find more medication at the pharmacy- but she still has a little left. Reviewed treatment and when to call for assistance will send note to PCP for review and any other advisement.  Reason for Disposition . Cold sores without complications  Answer Assessment - Initial Assessment Questions 1. APPEARANCE of BLISTERS: "Describe the sores."     Sore under chin under lip and beside nose ( patient has had one there before) 2. SIZE: "How large an area is involved with the cold sores?" (e.g., inches, cm or compare to coins)     Teardrop size at nose, dime/penny size at chin 3. LOCATION: "Which part of the lip is involved?"     Under lip area- middle, beside nose on left 4. ONSET: "When did the fever blisters begin?"     Tuesday/Wednesday 5. RECURRENT BLISTERS: "Have you had fever blisters before?" If so, ask: "When was the last time?" "How many times a year?"     Patient is using Abreva on them- and they are drying up- seasonal with patient 6. OTHER SYMPTOMS: "Do you have any other symptoms?" (e.g., fever, sores inside mouth)    Allergies, nasal drainage 7. PREGNANCY: "Is there any chance you are pregnant?" "When was your last menstrual period?"     n/a  Protocols used: COLD SORES (FEVER BLISTERS)-A-AH

## 2018-11-08 ENCOUNTER — Other Ambulatory Visit: Payer: Self-pay

## 2018-11-08 ENCOUNTER — Encounter: Payer: Self-pay | Admitting: Family Medicine

## 2018-11-08 ENCOUNTER — Ambulatory Visit (INDEPENDENT_AMBULATORY_CARE_PROVIDER_SITE_OTHER): Payer: Medicare Other | Admitting: Family Medicine

## 2018-11-08 VITALS — Temp 97.9°F | Ht 63.0 in | Wt 206.1 lb

## 2018-11-08 DIAGNOSIS — E559 Vitamin D deficiency, unspecified: Secondary | ICD-10-CM | POA: Diagnosis not present

## 2018-11-08 DIAGNOSIS — D692 Other nonthrombocytopenic purpura: Secondary | ICD-10-CM | POA: Diagnosis not present

## 2018-11-08 DIAGNOSIS — E669 Obesity, unspecified: Secondary | ICD-10-CM | POA: Diagnosis not present

## 2018-11-08 DIAGNOSIS — J301 Allergic rhinitis due to pollen: Secondary | ICD-10-CM | POA: Diagnosis not present

## 2018-11-08 DIAGNOSIS — R7982 Elevated C-reactive protein (CRP): Secondary | ICD-10-CM

## 2018-11-08 DIAGNOSIS — E785 Hyperlipidemia, unspecified: Secondary | ICD-10-CM | POA: Diagnosis not present

## 2018-11-08 DIAGNOSIS — B001 Herpesviral vesicular dermatitis: Secondary | ICD-10-CM | POA: Diagnosis not present

## 2018-11-08 MED ORDER — VALACYCLOVIR HCL 1 G PO TABS: 1000.0000 mg | ORAL_TABLET | Freq: Two times a day (BID) | ORAL | 0 refills | Status: AC

## 2018-11-08 MED ORDER — LORATADINE 10 MG PO TABS: 10.0000 mg | ORAL_TABLET | Freq: Every day | ORAL | 0 refills | Status: AC

## 2018-11-08 MED ORDER — VITAMIN D (ERGOCALCIFEROL) 1.25 MG (50000 UNIT) PO CAPS: 50000.0000 [IU] | ORAL_CAPSULE | ORAL | 0 refills | Status: DC

## 2018-11-08 NOTE — Progress Notes (Signed)
Name: Vanessa Hunter   MRN: [id number]    DOB: 03-25-1946   Date:11/08/2018       Progress Note  Subjective  Chief Complaint  Chief Complaint  Patient presents with  . Mouth Lesions    Onset-Monday-Underneath her chin-usually has them yearly when the pollen is out bad .Has tried abreva which has helped   . Allergic Rhinitis     Taking Allergy medication    I connected with@ on 11/08/18 at  1:40 PM EDT by a video enabled telemedicine application and verified that I am speaking with the correct person using two identifiers.  I discussed the limitations of evaluation and management by telemedicine and the availability of in person appointments. The patient expressed understanding and agreed to proceed. Staff also discussed with the patient that there may be a patient responsible charge related to this service. Patient Location: at home  Provider Location: at work  Additional Individuals present: none   HPI  Dyslipidemia: she has been taking  Crestor  daily , no side myalgias or any other side effects, lipid panel improved, LDL is down to 120. CRP still elevated . She is trying to eat a balanced diet   Elevated C-reactive protein: no joint pains, fever or weight loss, stable for the past 2 years   Obesity: she has gained some weight, BMI above 35. She has been physically active, three times a week, eating breakfast , lunch and dinner, making better food choices, weight is stable  Senile purpura: she states no recent bruising on arms.   AR: she states she has seasonal allergies , taking otc medication like Terraflu, advised to resume loratadine daily   Herpes Labialis: she has a long history of fever blister usually is only on upper lip ( by left nostril ) however this time it is also on her chin. She has been using Abreva. She denies pain or itching but topical medication helps.  No oozing   Patient Active Problem List   Diagnosis Date Noted  . Senile purpura ([place]) 03/07/2018  .  Elevated C-reactive protein 07/28/2017  . Allergic rhinitis 06/10/2015  . Dyslipidemia 06/10/2015  . Osteopenia 06/10/2015  . Pain in shoulder 06/10/2015  . Atrophy of vagina 06/10/2015  . History of diverticulitis 06/10/2015    Past Surgical History:  Procedure Laterality Date  . BREAST BIOPSY Right 03-06-13   RIGHT BREAST AT 10:30, intramammary lymph node  . BREAST BIOPSY Right    neg  . CHOLECYSTECTOMY    . COLONOSCOPY    . TONSILLECTOMY    . TUBAL LIGATION      Family History  Problem Relation Age of Onset  . Hearing loss Mother   . Migraines Mother   . Heart disease Father   . Heart disease Brother   . Cancer Maternal Aunt        breast  . Breast cancer Maternal Aunt   . Colon cancer Other   . Cancer Other        Colon-Niece  . Hyperlipidemia Sister     Social History   Socioeconomic History  . Marital status: Widowed    Spouse name: Not on file  . Number of children: 1  . Years of education: Not on file  . Highest education level: Bachelor's degree (e.g., BA, AB, BS)  Occupational History  . 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 Financial resource strain: Not hard at all  .  Food insecurity:    Worry: Never true    Inability: Never true  . Transportation needs:    Medical: No    Non-medical: No  Tobacco Use  . Smoking status: Never Smoker  . Smokeless tobacco: Never Used  Substance and Sexual Activity  . Alcohol use: No    Comment: very seldom   . Drug use: No  . Sexual activity: Yes  Lifestyle  . Physical activity:    Days per week: 3 days    Minutes per session: 60 min  . Stress: To some extent  Relationships  . Social connections:    Talks on phone: More than three times a week    Gets together: More than three times a week    Attends religious service: More than 4 times per year    Active member of club or organization: Yes    Attends meetings of clubs or organizations: More than 4 times  per year    Relationship status: Widowed  . Intimate partner violence:    Fear of current or ex partner: No    Emotionally abused: No    Physically abused: No    Forced sexual activity: No  Other Topics Concern  . Not on file  Social History Narrative   She is dating Berneta Sages since July 2017. They have know each other for a long time, but started  Summer 2017.    Son lives in [place].   She is originally from [place], moved to [place] and after retirement moved with husband to [place], but once her husband died in 11/20/09 she moved back to [place] in 11-21-2011   Mother still living and also sister are here in [place].    She has two grandson's      Current Outpatient Medications:  .  alendronate (FOSAMAX) 35 MG tablet, Take 1 tablet (35 mg total) by mouth every 7 (seven) days. Take with a full glass of water on an empty stomach., Disp: 12 tablet, Rfl: 3 .  rosuvastatin (CRESTOR) 40 MG tablet, Take 1 tablet (40 mg total) by mouth daily., Disp: 90 tablet, Rfl: 1 .  Vitamin D, Ergocalciferol, (DRISDOL) 1.25 MG (50000 UT) CAPS capsule, Take 1 capsule (50,000 Units total) by mouth every 7 (seven) days., Disp: 12 capsule, Rfl: 0 .  loratadine (CLARITIN) 10 MG tablet, Take 1 tablet (10 mg total) by mouth daily., Disp: 100 tablet, Rfl: 0 .  valACYclovir (VALTREX) 1000 MG tablet, Take 1 tablet (1,000 mg total) by mouth 2 (two) times daily., Disp: 6 tablet, Rfl: 0  Allergies  Allergen Reactions  . Lac Bovis     lactose intolerance  . Orange Juice [Orange Oil] Itching    I personally reviewed active problem list, medication list, allergies, family history, social history with the patient/caregiver today.   ROS  Constitutional: Negative for fever or weight change.  Respiratory: Negative for cough and shortness of breath.   Cardiovascular: Negative for chest pain or palpitations.  Gastrointestinal: Negative for abdominal pain, no bowel changes.  Musculoskeletal: Negative for gait problem or joint swelling.  Skin:  Positive  for rash.  Neurological: Negative for dizziness or headache.  No other specific complaints in a complete review of systems (except as listed in HPI above).  Objective  Virtual encounter, vitals not obtained.  Body mass index is 36.51 kg/m.  Physical Exam  Weight at home today was 206 lbs   Patient is awake, alert , normal speech, oriented Facial rash : cluster of vesicles  with erythematous base and mild crusting, no oozing under left nostril and also on her chin   PHQ2/9: Depression screen [facility] 2/9 11/08/2018 11/08/2018 09/13/2018 03/07/2018 07/28/2017  Decreased Interest 0 0 0 0 0  Down, Depressed, Hopeless 0 0 0 0 0  PHQ - 2 Score 0 0 0 0 0  Altered sleeping 0 0 - - -  Tired, decreased energy 0 0 - - -  Change in appetite 0 - - - -  Feeling bad or failure about yourself  0 - - - -  Trouble concentrating 0 - - - -  Moving slowly or fidgety/restless 0 - - - -  Suicidal thoughts 0 - - - -  PHQ-9 Score 0 0 - - -  Difficult doing work/chores Not difficult at all - - - -   PHQ-2/9 Result is negative.    Fall Risk: Fall Risk  11/08/2018 09/13/2018 03/07/2018 07/28/2017 11/22/2016  Falls in the past year? 0 0 No No No  Number falls in past yr: 0 0 - - -  Injury with Fall? 0 0 - - -  Follow up - Falls prevention discussed - - -    Assessment & Plan  1. Vitamin D deficiency  - Vitamin D, Ergocalciferol, (DRISDOL) 1.25 MG (50000 UT) CAPS capsule; Take 1 capsule (50,000 Units total) by mouth every 7 (seven) days.  Dispense: 12 capsule; Refill: 0  2. Herpes labialis  - valACYclovir (VALTREX) 1000 MG tablet; Take 1 tablet (1,000 mg total) by mouth 2 (two) times daily.  Dispense: 6 tablet; Refill: 0  3. Dyslipidemia  Continue Crestor   4. Senile purpura ([place])  Doing better  5. Elevated C-reactive protein  We will continue to monitor   6. Obesity (BMI 35.0-39.9 without comorbidity)  Discussed with the patient the risk posed by an increased BMI. Discussed  importance of portion control, calorie counting and at least 150 minutes of physical activity weekly. Avoid sweet beverages and drink more water. Eat at least 6 servings of fruit and vegetables daily   7. Seasonal allergic rhinitis due to pollen  Stop [place]er  plus  - loratadine (CLARITIN) 10 MG tablet; Take 1 tablet (10 mg total) by mouth daily.  Dispense: 100 tablet; Refill: 0  I discussed the assessment and treatment plan with the patient. The patient was provided an opportunity to ask questions and all were answered. The patient agreed with the plan and demonstrated an understanding of the instructions.  The patient was advised to call back or seek an in-person evaluation if the symptoms worsen or if the condition fails to improve as anticipated.  I provided 26  minutes of non-face-to-face time during this encounter.

## 2019-02-17 ENCOUNTER — Other Ambulatory Visit: Payer: Self-pay

## 2019-02-17 ENCOUNTER — Ambulatory Visit (INDEPENDENT_AMBULATORY_CARE_PROVIDER_SITE_OTHER): Payer: Medicare Other | Admitting: Family Medicine

## 2019-02-17 ENCOUNTER — Encounter: Payer: Self-pay | Admitting: Family Medicine

## 2019-02-17 VITALS — BP 136/64 | HR 77 | Temp 96.9°F | Resp 16 | Ht 63.0 in | Wt 205.3 lb

## 2019-02-17 DIAGNOSIS — E785 Hyperlipidemia, unspecified: Secondary | ICD-10-CM

## 2019-02-17 DIAGNOSIS — R7982 Elevated C-reactive protein (CRP): Secondary | ICD-10-CM

## 2019-02-17 DIAGNOSIS — J3089 Other allergic rhinitis: Secondary | ICD-10-CM

## 2019-02-17 DIAGNOSIS — M85852 Other specified disorders of bone density and structure, left thigh: Secondary | ICD-10-CM

## 2019-02-17 DIAGNOSIS — M5416 Radiculopathy, lumbar region: Secondary | ICD-10-CM | POA: Diagnosis not present

## 2019-02-17 DIAGNOSIS — J302 Other seasonal allergic rhinitis: Secondary | ICD-10-CM

## 2019-02-17 NOTE — Patient Instructions (Signed)
Sciatica Rehab Ask your health care provider which exercises are safe for you. Do exercises exactly as told by your health care provider and adjust them as directed. It is normal to feel mild stretching, pulling, tightness, or discomfort as you do these exercises. Stop right away if you feel sudden pain or your pain gets worse. Do not begin these exercises until told by your health care provider. Stretching and range-of-motion exercises These exercises warm up your muscles and joints and improve the movement and flexibility of your hips and back. These exercises also help to relieve pain, numbness, and tingling. Sciatic nerve glide 1. Sit in a chair with your head facing down toward your chest. Place your hands behind your back. Let your shoulders slump forward. 2. Slowly straighten one of your legs while you tilt your head back as if you are looking toward the ceiling. Only straighten your leg as far as you can without making your symptoms worse. 3. Hold this position for __________ seconds. 4. Slowly return to the starting position. 5. Repeat with your other leg. Repeat __________ times. Complete this exercise __________ times a day. Knee to chest with hip adduction and internal rotation  1. Lie on your back on a firm surface with both legs straight. 2. Bend one of your knees and move it up toward your chest until you feel a gentle stretch in your lower back and buttock. Then, move your knee toward the shoulder that is on the opposite side from your leg. This is hip adduction and internal rotation. ? Hold your leg in this position by holding on to the front of your knee. 3. Hold this position for __________ seconds. 4. Slowly return to the starting position. 5. Repeat with your other leg. Repeat __________ times. Complete this exercise __________ times a day. Prone extension on elbows  1. Lie on your abdomen on a firm surface. A bed may be too soft for this exercise. 2. Prop yourself up on  your elbows. 3. Use your arms to help lift your chest up until you feel a gentle stretch in your abdomen and your lower back. ? This will place some of your body weight on your elbows. If this is uncomfortable, try stacking pillows under your chest. ? Your hips should stay down, against the surface that you are lying on. Keep your hip and back muscles relaxed. 4. Hold this position for __________ seconds. 5. Slowly relax your upper body and return to the starting position. Repeat __________ times. Complete this exercise __________ times a day. Strengthening exercises These exercises build strength and endurance in your back. Endurance is the ability to use your muscles for a long time, even after they get tired. Pelvic tilt This exercise strengthens the muscles that lie deep in the abdomen. 1. Lie on your back on a firm surface. Bend your knees and keep your feet flat on the floor. 2. Tense your abdominal muscles. Tip your pelvis up toward the ceiling and flatten your lower back into the floor. ? To help with this exercise, you may place a small towel under your lower back and try to push your back into the towel. 3. Hold this position for __________ seconds. 4. Let your muscles relax completely before you repeat this exercise. Repeat __________ times. Complete this exercise __________ times a day. Alternating arm and leg raises  1. Get on your hands and knees on a firm surface. If you are on a hard floor, you may want to use   padding, such as an exercise mat, to cushion your knees. 2. Line up your arms and legs. Your hands should be directly below your shoulders, and your knees should be directly below your hips. 3. Lift your left leg behind you. At the same time, raise your right arm and straighten it in front of you. ? Do not lift your leg higher than your hip. ? Do not lift your arm higher than your shoulder. ? Keep your abdominal and back muscles tight. ? Keep your hips facing the  ground. ? Do not arch your back. ? Keep your balance carefully, and do not hold your breath. 4. Hold this position for __________ seconds. 5. Slowly return to the starting position. 6. Repeat with your right leg and your left arm. Repeat __________ times. Complete this exercise __________ times a day. Posture and body mechanics Good posture and healthy body mechanics can help to relieve stress in your body's tissues and joints. Body mechanics refers to the movements and positions of your body while you do your daily activities. Posture is part of body mechanics. Good posture means:  Your spine is in its natural S-curve position (neutral).  Your shoulders are pulled back slightly.  Your head is not tipped forward. Follow these guidelines to improve your posture and body mechanics in your everyday activities. Standing   When standing, keep your spine neutral and your feet about hip width apart. Keep a slight bend in your knees. Your ears, shoulders, and hips should line up.  When you do a task in which you stand in one place for a long time, place one foot up on a stable object that is 2-4 inches (5-10 cm) high, such as a footstool. This helps keep your spine neutral. Sitting   When sitting, keep your spine neutral and keep your feet flat on the floor. Use a footrest, if necessary, and keep your thighs parallel to the floor. Avoid rounding your shoulders, and avoid tilting your head forward.  When working at a desk or a computer, keep your desk at a height where your hands are slightly lower than your elbows. Slide your chair under your desk so you are close enough to maintain good posture.  When working at a computer, place your monitor at a height where you are looking straight ahead and you do not have to tilt your head forward or downward to look at the screen. Resting  When lying down and resting, avoid positions that are most painful for you.  If you have pain with activities  such as sitting, bending, stooping, or squatting, lie in a position in which your body does not bend very much. For example, avoid curling up on your side with your arms and knees near your chest (fetal position).  If you have pain with activities such as standing for a long time or reaching with your arms, lie with your spine in a neutral position and bend your knees slightly. Try the following positions: ? Lying on your side with a pillow between your knees. ? Lying on your back with a pillow under your knees. Lifting   When lifting objects, keep your feet at least shoulder width apart and tighten your abdominal muscles.  Bend your knees and hips and keep your spine neutral. It is important to lift using the strength of your legs, not your back. Do not lock your knees straight out.  Always ask for help to lift heavy or awkward objects. This information is not   intended to replace advice given to you by your health care provider. Make sure you discuss any questions you have with your health care provider. Document Released: 07/31/2005 Document Revised: 11/22/2018 Document Reviewed: 08/22/2018 Elsevier Patient Education  2020 [employer]  

## 2019-02-17 NOTE — Progress Notes (Signed)
Name: Vanessa Hunter   MRN: [id number]    DOB: 12-23-1945   Date:02/17/2019       Progress Note  Subjective  Chief Complaint  Chief Complaint  Patient presents with  . Medication Refill    6 month F/U  . Dyslipidemia  . Osteopenia  . Elevated C-reactive protein  . Obesity  . Senile purpura  . Leg Cramps    Onset-1 month, right thigh, intermittently is a cramping feeling    HPI  Dyslipidemia: shehas been taking  Crestor  daily , no side myalgias or any other side effects, lipid panel improved, LDL is down to 120. CRP still elevated, we will check high sensitivity CRP next time we check labs  .   Elevated C-reactive protein: no joint pains, fever or weight loss, stable for the past 2 years  she wants to hold off on rechecking labs  Vitamin D : she takes weekly rx vitamin D , it was down to 17 in 2018  Obesity: her weight is stable, she has been walking around her house, she states she usually loses weight during the Summer because she eats more vegetables and salad  AR: she states she has seasonal allergies , taking otc medication  Prn, she usually responds to tree pollen and grass pollen so she avoids going outside   Right lumbar radiculitis she states about one month ago she noticed a pain on right lumbar spine that goes down to right quad, no numbness no bowel or bladder incontinence, symptoms are mild and intermittent. Triggered by changing in positions.    Patient Active Problem List   Diagnosis Date Noted  . Senile purpura ([place]) 03/07/2018  . Elevated C-reactive protein 07/28/2017  . Allergic rhinitis 06/10/2015  . Dyslipidemia 06/10/2015  . Osteopenia 06/10/2015  . Pain in shoulder 06/10/2015  . Atrophy of vagina 06/10/2015  . History of diverticulitis 06/10/2015    Past Surgical History:  Procedure Laterality Date  . BREAST BIOPSY Right 03-06-13   RIGHT BREAST AT 10:30, intramammary lymph node  . BREAST BIOPSY Right    neg  . CHOLECYSTECTOMY    .  COLONOSCOPY    . TONSILLECTOMY    . TUBAL LIGATION      Family History  Problem Relation Age of Onset  . Hearing loss Mother   . Migraines Mother   . Heart disease Father   . Heart disease Brother   . Cancer Maternal Aunt        breast  . Breast cancer Maternal Aunt   . Colon cancer Other   . Cancer Other        Colon-Niece  . Hyperlipidemia Sister     Social History   Socioeconomic History  . Marital status: Widowed    Spouse name: Not on file  . Number of children: 1  . Years of education: Not on file  . Highest education level: Bachelor's degree (e.g., BA, AB, BS)  Occupational History  . 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 Financial resource strain: Not hard at all  . Food insecurity    Worry: Never true    Inability: Never true  . Transportation needs    Medical: No    Non-medical: No  Tobacco Use  . Smoking status: Never Smoker  . Smokeless tobacco: Never Used  Substance and Sexual Activity  . Alcohol use: No    Comment: very seldom   . Drug  use: No  . Sexual activity: Yes  Lifestyle  . Physical activity    Days per week: 3 days    Minutes per session: 60 min  . Stress: To some extent  Relationships  . Social connections    Talks on phone: More than three times a week    Gets together: More than three times a week    Attends religious service: More than 4 times per year    Active member of club or organization: Yes    Attends meetings of clubs or organizations: More than 4 times per year    Relationship status: Widowed  . Intimate partner violence    Fear of current or ex partner: No    Emotionally abused: No    Physically abused: No    Forced sexual activity: No  Other Topics Concern  . Not on file  Social History Narrative   She is dating Berneta Sages since July 2017. They have know each other for a long time, but started  Summer 2017.    Son lives in [place].   She is originally from [place], moved to [place]  and after retirement moved with husband to [place], but once her husband died in 11/23/2009 she moved back to [place] in 11/24/2011   Mother still living and also sister are here in [place].    She has two grandson's      Current Outpatient Medications:  .  alendronate (FOSAMAX) 35 MG tablet, Take 1 tablet (35 mg total) by mouth every 7 (seven) days. Take with a full glass of water on an empty stomach., Disp: 12 tablet, Rfl: 3 .  loratadine (CLARITIN) 10 MG tablet, Take 1 tablet (10 mg total) by mouth daily., Disp: 100 tablet, Rfl: 0 .  rosuvastatin (CRESTOR) 40 MG tablet, Take 1 tablet (40 mg total) by mouth daily., Disp: 90 tablet, Rfl: 1 .  Vitamin D, Ergocalciferol, (DRISDOL) 1.25 MG (50000 UT) CAPS capsule, Take 1 capsule (50,000 Units total) by mouth every 7 (seven) days., Disp: 12 capsule, Rfl: 0 .  valACYclovir (VALTREX) 1000 MG tablet, Take 1 tablet (1,000 mg total) by mouth 2 (two) times daily. (Patient not taking: Reported on 02/17/2019), Disp: 6 tablet, Rfl: 0  Allergies  Allergen Reactions  . Lac Bovis     lactose intolerance  . Orange Juice [Orange Oil] Itching    I personally reviewed active problem list, medication list, allergies, family history, social history with the patient/caregiver today.   ROS  Constitutional: Negative for fever or weight change.  Respiratory: Negative for cough and shortness of breath.   Cardiovascular: Negative for chest pain or palpitations.  Gastrointestinal: Negative for abdominal pain, no bowel changes.  Musculoskeletal: Negative for gait problem or joint swelling.  Skin: Negative for rash.  Neurological: Negative for dizziness or headache.  No other specific complaints in a complete review of systems (except as listed in HPI above).  Objective  Vitals:   02/17/19 1448  BP: 136/64  Pulse: 77  Resp: 16  Temp: (!) 96.9 F (36.1 C)  TempSrc: Temporal  SpO2: 99%  Weight: 205 lb 4.8 oz (93.1 kg)  Height: 5\' 3"  (1.6 m)    Body mass index is 36.37  kg/m.  Physical Exam  Constitutional: Patient appears well-developed and well-nourished. Obese  No distress.  HEENT: head atraumatic, normocephalic, pupils equal and reactive to light, neck supple, oral mucosa not done  Cardiovascular: Normal rate, regular rhythm and normal heart sounds.  No murmur heard. No BLE  edema. Pulmonary/Chest: Effort normal and breath sounds normal. No respiratory distress. Abdominal: Soft.  There is no tenderness. Psychiatric: Patient has a normal mood and affect. behavior is normal. Judgment and thought content normal.  PHQ2/9: Depression screen [facility] 2/9 02/17/2019 11/08/2018 11/08/2018 09/13/2018 03/07/2018  Decreased Interest 0 0 0 0 0  Down, Depressed, Hopeless 0 0 0 0 0  PHQ - 2 Score 0 0 0 0 0  Altered sleeping 0 0 0 - -  Tired, decreased energy 0 0 0 - -  Change in appetite 0 0 - - -  Feeling bad or failure about yourself  0 0 - - -  Trouble concentrating 0 0 - - -  Moving slowly or fidgety/restless 0 0 - - -  Suicidal thoughts 0 0 - - -  PHQ-9 Score 0 0 0 - -  Difficult doing work/chores Not difficult at all Not difficult at all - - -    phq 9 is negative   Fall Risk: Fall Risk  02/17/2019 11/08/2018 09/13/2018 03/07/2018 07/28/2017  Falls in the past year? 0 0 0 No No  Number falls in past yr: 0 0 0 - -  Injury with Fall? 0 0 0 - -  Follow up - - Falls prevention discussed - -     Functional Status Survey: Is the patient deaf or have difficulty hearing?: No Does the patient have difficulty seeing, even when wearing glasses/contacts?: No Does the patient have difficulty concentrating, remembering, or making decisions?: No Does the patient have difficulty walking or climbing stairs?: No Does the patient have difficulty dressing or bathing?: No Does the patient have difficulty doing errands alone such as visiting a doctor's office or shopping?: No    Assessment & Plan  1. Dyslipidemia  On statin therapy   2. Elevated C-reactive  protein  Recheck next visit   3. Osteopenia of neck of left femur   4. Perennial allergic rhinitis with seasonal variation  Stable at this time   5. Right lumbar radiculitis  Discussed home exercise

## 2019-03-12 ENCOUNTER — Ambulatory Visit (INDEPENDENT_AMBULATORY_CARE_PROVIDER_SITE_OTHER): Payer: Medicare Other | Admitting: Family Medicine

## 2019-03-12 ENCOUNTER — Other Ambulatory Visit: Payer: Self-pay

## 2019-03-12 ENCOUNTER — Encounter: Payer: Self-pay | Admitting: Family Medicine

## 2019-03-12 DIAGNOSIS — Z20822 Contact with and (suspected) exposure to covid-19: Secondary | ICD-10-CM

## 2019-03-12 DIAGNOSIS — Z20828 Contact with and (suspected) exposure to other viral communicable diseases: Secondary | ICD-10-CM

## 2019-03-12 DIAGNOSIS — Z7189 Other specified counseling: Secondary | ICD-10-CM | POA: Diagnosis not present

## 2019-03-12 NOTE — Progress Notes (Signed)
Name: Vanessa Hunter   MRN: [id number]    DOB: Dec 23, 1945   Date:03/12/2019       Progress Note  Subjective  Chief Complaint  Chief Complaint  Patient presents with  . covid    Patient states was exsposed by boyfriends grandkids, whose babysitter tested positive.  Kids test came back negative    I connected with  [name]  on 03/12/19 at 10:40 AM EDT by a video enabled telemedicine application and verified that I am speaking with the correct person using two identifiers.  I discussed the limitations of evaluation and management by telemedicine and the availability of in person appointments. The patient expressed understanding and agreed to proceed. Staff also discussed with the patient that there may be a patient responsible charge related to this service. Patient Location: Home Provider Location: Home Additional Individuals present: None  HPI  Pt presents with concern for possible COVID-19 exposure.  Her boyfriend's grandchildren's babysitter tested positive for COVID-19.  The grandchildren all tested negative, the boyfriend was also tested but is awaiting results. Neither her boyfriend nor the patient has had contact with the COVID-19+ babysitter.  She denies fevers, body aches, no loss taste or smell, shortness of breath, chest tightness.  Reports ongoing cough for many months now due to her allergies.   Patient Active Problem List   Diagnosis Date Noted  . Senile purpura ([place]) 03/07/2018  . Elevated C-reactive protein 07/28/2017  . Allergic rhinitis 06/10/2015  . Dyslipidemia 06/10/2015  . Osteopenia 06/10/2015  . Pain in shoulder 06/10/2015  . Atrophy of vagina 06/10/2015  . History of diverticulitis 06/10/2015    Social History   Tobacco Use  . Smoking status: Never Smoker  . Smokeless tobacco: Never Used  Substance Use Topics  . Alcohol use: No    Comment: very seldom      Current Outpatient Medications:  .  loratadine (CLARITIN) 10 MG tablet, Take 1 tablet  (10 mg total) by mouth daily., Disp: 100 tablet, Rfl: 0 .  valACYclovir (VALTREX) 1000 MG tablet, Take 1 tablet (1,000 mg total) by mouth 2 (two) times daily., Disp: 6 tablet, Rfl: 0 .  Vitamin D, Ergocalciferol, (DRISDOL) 1.25 MG (50000 UT) CAPS capsule, Take 1 capsule (50,000 Units total) by mouth every 7 (seven) days., Disp: 12 capsule, Rfl: 0 .  alendronate (FOSAMAX) 35 MG tablet, Take 1 tablet (35 mg total) by mouth every 7 (seven) days. Take with a full glass of water on an empty stomach. (Patient not taking: Reported on 03/12/2019), Disp: 12 tablet, Rfl: 3 .  rosuvastatin (CRESTOR) 40 MG tablet, Take 1 tablet (40 mg total) by mouth daily. (Patient not taking: Reported on 03/12/2019), Disp: 90 tablet, Rfl: 1  Allergies  Allergen Reactions  . Lac Bovis     lactose intolerance  . Orange Juice [Orange Oil] Itching    I personally reviewed active problem list, medication list, allergies, notes from last encounter with the patient/caregiver today.  ROS  Constitutional: Negative for fever or weight change.  Respiratory: Negative for cough and shortness of breath.   Cardiovascular: Negative for chest pain or palpitations.  Gastrointestinal: Negative for abdominal pain, no bowel changes.  Musculoskeletal: Negative for gait problem or joint swelling.  Skin: Negative for rash.  Neurological: Negative for dizziness or headache.  No other specific complaints in a complete review of systems (except as listed in HPI above).  Objective  Virtual encounter, vitals not obtained. Temp at home - 97.75F oral  There is no height or weight on file to calculate BMI.  Nursing Note and Vital Signs reviewed.  Physical Exam  Pulmonary/Chest: Effort normal. No respiratory distress. Speaking in complete sentences Neurological: Pt is alert and oriented to person, place, and time. Speech is normal. Psychiatric: Patient has a normal mood and affect. behavior is normal. Judgment and thought content normal.   No results found for this or any previous visit (from the past 72 hour(s)).  Assessment & Plan  1. Educated About Covid-19 Virus Infection - Education provided at [employer]; she is wearing a mask in public, will quarantine while awaiting her boyfriend's test results.   2. Exposure to Covid-19 Virus - Extremely low risk possible exposure.  Recommend she continue symptom and temperature monitoring at this time along with quarantining. If boyfriend's test is negative, she may proceed with routine measures.  If he is positive, we will send her for testing.   -Red flags and when to present for emergency care or RTC including fever >101.42F, chest pain, shortness of breath, new/worsening/un-resolving symptoms,  reviewed with patient at time of visit. Follow up and care instructions discussed and provided in AVS. - I discussed the assessment and treatment plan with the patient. The patient was provided an opportunity to ask questions and all were answered. The patient agreed with the plan and demonstrated an understanding of the instructions.  I provided 12 minutes of non-face-to-face time during this encounter.  Hubbard Hartshorn, FNP

## 2019-05-29 ENCOUNTER — Ambulatory Visit: Payer: Medicare Other | Admitting: Family Medicine

## 2019-05-29 ENCOUNTER — Ambulatory Visit (INDEPENDENT_AMBULATORY_CARE_PROVIDER_SITE_OTHER): Payer: Medicare Other | Admitting: Family Medicine

## 2019-05-29 ENCOUNTER — Encounter: Payer: Self-pay | Admitting: Family Medicine

## 2019-05-29 VITALS — Temp 97.7°F

## 2019-05-29 DIAGNOSIS — R197 Diarrhea, unspecified: Secondary | ICD-10-CM

## 2019-05-29 DIAGNOSIS — R5383 Other fatigue: Secondary | ICD-10-CM | POA: Diagnosis not present

## 2019-05-29 NOTE — Progress Notes (Signed)
Name: Vanessa Hunter   MRN: [id number]    DOB: 08/21/1945   Date:05/29/2019       Progress Note  Subjective  Chief Complaint  Chief Complaint  Patient presents with  . Fatigue    Thinks she has food poisoning after eating fish at [employer], Onset- Saturday morning, started having pain in her stomach was sore.   . Diarrhea    Watery stools  . loss of appetite    I connected with  [name] on 05/29/19 at  1:20 PM EDT by telephone and verified that I am speaking with the correct person using two identifiers.   I discussed the limitations, risks, security and privacy concerns of performing an evaluation and management service by telephone and the availability of in person appointments. Staff also discussed with the patient that there may be a patient responsible charge related to this service. Patient Location: Home Provider Location: Office Additional Individuals present: none  HPI  Pt presents for concern for GI upset.  She reports eating out and having fish, coleslaw, and hush puppies on Friday, she woke up Saturday with abdominal pain.  In the last few days now she has had abdominal soreness which has since resolved, some intermittent diarrhea, fatigue, and decreased appetite.  No nausea or vomiting, fever/chills, changes in taste/smell, no blood in stool. Eating bland diet - jello, applesauce, crackers, clear liquids. Last episode of diarrhea was yesterday - small amount of loose stool, no BM today.  She took gas-X which did relieve some of her symptoms.  She feels that she is overall improving.   Patient Active Problem List   Diagnosis Date Noted  . Senile purpura ([place]) 03/07/2018  . Elevated C-reactive protein 07/28/2017  . Allergic rhinitis 06/10/2015  . Dyslipidemia 06/10/2015  . Osteopenia 06/10/2015  . Pain in shoulder 06/10/2015  . Atrophy of vagina 06/10/2015  . History of diverticulitis 06/10/2015    Social History   Tobacco Use  . Smoking status: Never  Smoker  . Smokeless tobacco: Never Used  Substance Use Topics  . Alcohol use: No    Comment: very seldom      Current Outpatient Medications:  .  alendronate (FOSAMAX) 35 MG tablet, Take 1 tablet (35 mg total) by mouth every 7 (seven) days. Take with a full glass of water on an empty stomach., Disp: 12 tablet, Rfl: 3 .  loratadine (CLARITIN) 10 MG tablet, Take 1 tablet (10 mg total) by mouth daily., Disp: 100 tablet, Rfl: 0 .  rosuvastatin (CRESTOR) 40 MG tablet, Take 1 tablet (40 mg total) by mouth daily., Disp: 90 tablet, Rfl: 1 .  valACYclovir (VALTREX) 1000 MG tablet, Take 1 tablet (1,000 mg total) by mouth 2 (two) times daily., Disp: 6 tablet, Rfl: 0 .  Vitamin D, Ergocalciferol, (DRISDOL) 1.25 MG (50000 UT) CAPS capsule, Take 1 capsule (50,000 Units total) by mouth every 7 (seven) days., Disp: 12 capsule, Rfl: 0  Allergies  Allergen Reactions  . Lac Bovis     lactose intolerance  . Orange Juice [Orange Oil] Itching    I personally reviewed active problem list, medication list, allergies, health maintenance, notes from last encounter, lab results with the patient/caregiver today.  ROS  Ten systems reviewed and is negative except as mentioned in HPI  Objective  Virtual encounter, vitals not obtained.  There is no height or weight on file to calculate BMI.  Nursing Note and Vital Signs reviewed.  Physical Exam  Pulmonary/Chest: Effort normal.  No respiratory distress. Speaking in complete sentences Neurological: Pt is alert and oriented to person, place, and time.Speech is normal Psychiatric: Patient has a normal mood and affect. behavior is normal. Judgment and thought content normal.  No results found for this or any previous visit (from the past 72 hour(s)).  Assessment & Plan  1. Diarrhea, unspecified type 2. Fatigue, unspecified type - Discussed in detail with the patient that this is likely viral and/or possible food poisoning.  She is slowly improving,  tolerating slowly advancing her diet and PO liquids.  We will check in tomorrow to Hunter if she has had ongoing improvement.  -Red flags and when to present for emergency care or RTC including fever >101.36F, chest pain, shortness of breath, new/worsening/un-resolving symptoms, reviewed with patient at time of visit. Follow up and care instructions discussed and provided in AVS. - I discussed the assessment and treatment plan with the patient. The patient was provided an opportunity to ask questions and all were answered. The patient agreed with the plan and demonstrated an understanding of the instructions.  - The patient was advised to call back or seek an in-person evaluation if the symptoms worsen or if the condition fails to improve as anticipated.  I provided 14 minutes of non-face-to-face time during this encounter.  Hubbard Hartshorn, FNP

## 2019-05-30 ENCOUNTER — Telehealth: Payer: Self-pay | Admitting: Family Medicine

## 2019-05-30 NOTE — Telephone Encounter (Signed)
Spoke to patient and she stated she is feeling better.

## 2019-05-30 NOTE — Telephone Encounter (Signed)
Documentation reviewed 

## 2019-05-30 NOTE — Telephone Encounter (Signed)
-----   Message from Hubbard Hartshorn, [place] sent at 05/29/2019  1:01 PM EDT ----- Regarding: Call patient to check in Please call patient to check in - if not improving much over the last 24 hours, we will [facility].

## 2019-05-31 ENCOUNTER — Other Ambulatory Visit: Payer: Self-pay

## 2019-05-31 ENCOUNTER — Ambulatory Visit
Admission: EM | Admit: 2019-05-31 | Discharge: 2019-05-31 | Disposition: A | Discharge status: Home / Self Care | Payer: Medicare Other | Attending: Family Medicine | Admitting: Family Medicine

## 2019-05-31 ENCOUNTER — Encounter: Payer: Self-pay | Admitting: Emergency Medicine

## 2019-05-31 DIAGNOSIS — E785 Hyperlipidemia, unspecified: Secondary | ICD-10-CM | POA: Insufficient documentation

## 2019-05-31 DIAGNOSIS — R197 Diarrhea, unspecified: Secondary | ICD-10-CM | POA: Diagnosis not present

## 2019-05-31 DIAGNOSIS — Z7189 Other specified counseling: Secondary | ICD-10-CM

## 2019-05-31 DIAGNOSIS — N3001 Acute cystitis with hematuria: Secondary | ICD-10-CM | POA: Diagnosis not present

## 2019-05-31 DIAGNOSIS — R319 Hematuria, unspecified: Secondary | ICD-10-CM | POA: Diagnosis not present

## 2019-05-31 DIAGNOSIS — N390 Urinary tract infection, site not specified: Secondary | ICD-10-CM | POA: Insufficient documentation

## 2019-05-31 DIAGNOSIS — E876 Hypokalemia: Secondary | ICD-10-CM | POA: Diagnosis not present

## 2019-05-31 DIAGNOSIS — Z20828 Contact with and (suspected) exposure to other viral communicable diseases: Secondary | ICD-10-CM | POA: Diagnosis not present

## 2019-05-31 LAB — URINALYSIS, COMPLETE (UACMP) WITH MICROSCOPIC
Glucose, UA: NEGATIVE mg/dL
Ketones, ur: 40 mg/dL — AB
Nitrite: POSITIVE — AB
Protein, ur: 100 mg/dL — AB
Specific Gravity, Urine: 1.02 (ref 1.005–1.030)
WBC, UA: >50 WBC/hpf (ref 0–5)
pH: 6 (ref 5.0–8.0)

## 2019-05-31 LAB — CBC WITH DIFFERENTIAL/PLATELET
Abs Immature Granulocytes: 0.18 10*3/uL — ABNORMAL HIGH (ref 0.00–0.07)
Basophils Absolute: 0.1 10*3/uL (ref 0.0–0.1)
Basophils Relative: 1 %
Eosinophils Absolute: 0 10*3/uL (ref 0.0–0.5)
Eosinophils Relative: 0 %
HCT: 42.6 % (ref 36.0–46.0)
Hemoglobin: 14.6 g/dL (ref 12.0–15.0)
Immature Granulocytes: 2 %
Lymphocytes Relative: 17 %
Lymphs Abs: 1.9 10*3/uL (ref 0.7–4.0)
MCH: 29.8 pg (ref 26.0–34.0)
MCHC: 34.3 g/dL (ref 30.0–36.0)
MCV: 86.9 fL (ref 80.0–100.0)
Monocytes Absolute: 1.1 10*3/uL — ABNORMAL HIGH (ref 0.1–1.0)
Monocytes Relative: 10 %
Neutro Abs: 7.8 10*3/uL — ABNORMAL HIGH (ref 1.7–7.7)
Neutrophils Relative %: 70 %
Platelets: 340 10*3/uL (ref 150–400)
RBC: 4.9 MIL/uL (ref 3.87–5.11)
RDW: 11.7 % (ref 11.5–15.5)
WBC: 11.1 10*3/uL — ABNORMAL HIGH (ref 4.0–10.5)
nRBC: 0 % (ref 0.0–0.2)

## 2019-05-31 LAB — BASIC METABOLIC PANEL
Anion gap: 13 (ref 5–15)
BUN: 17 mg/dL (ref 8–23)
CO2: 26 mmol/L (ref 22–32)
Calcium: 8.9 mg/dL (ref 8.9–10.3)
Chloride: 92 mmol/L — ABNORMAL LOW (ref 98–111)
Creatinine, Ser: 0.86 mg/dL (ref 0.44–1.00)
GFR calc Af Amer: >60 mL/min (ref >60)
GFR calc non Af Amer: >60 mL/min (ref >60)
Glucose, Bld: 132 mg/dL — ABNORMAL HIGH (ref 70–99)
Potassium: 3.3 mmol/L — ABNORMAL LOW (ref 3.5–5.1)
Sodium: 131 mmol/L — ABNORMAL LOW (ref 135–145)

## 2019-05-31 MED ORDER — POTASSIUM CHLORIDE CRYS ER 20 MEQ PO TBCR
20.0000 meq | EXTENDED_RELEASE_TABLET | Freq: Once | ORAL | Status: AC
  Administered 2019-05-31: 16:00:00 20 meq via ORAL

## 2019-05-31 MED ORDER — FLUCONAZOLE 150 MG PO TABS: 150.0000 mg | ORAL_TABLET | Freq: Every day | ORAL | 0 refills | Status: DC

## 2019-05-31 MED ORDER — CEPHALEXIN 500 MG PO CAPS
500.0000 mg | ORAL_CAPSULE | Freq: Two times a day (BID) | ORAL | 0 refills | Status: AC
Start: 2019-05-31 — End: 2019-06-07

## 2019-05-31 NOTE — ED Provider Notes (Addendum)
[facility] ____________________________________________  Time seen: Approximately 3:52 PM  I have reviewed the triage vital signs and the nursing notes.   HISTORY  Chief Complaint Diarrhea   HPI Vanessa Hunter is a 73 y.o. female presenting for evaluation of diarrhea.  Patient reports last Friday she ate out eating barbecue, coleslaw and hush puppies.  States that she did not complete her meal as well eating it tasted odd.  Reports the next morning she woke up with diffuse abdominal discomfort that lasted through Sunday followed by diarrhea.  Reports both Monday and Tuesday she had approximate 2 episodes of loose stool each day.  Reports Wednesday and Thursday as well as yesterday she was feeling better.  However states she did have generalized weakness throughout this.  Reports today had another episode of diarrhea prompting her to come in.  No accompanying vomiting or nausea.  States abdominal pain has resolved and has Continued to stay away.  Denies any abdominal pain at this time.  Does report earlier in the week she was also having nasal congestion, none now.  No accompanying fevers.  Reports diarrhea normal coloration, no black or bloody stool.  Denies cough.  Denies chest pain or shortness of breath.  Denies known sick contacts.  Has been drinking plenty of fluids but has had decreased appetite.  Did eat noodles and bananas yesterday.  No other recent sickness.  Denies dysuria but reports urine had some odor to urine. No recent antibiotic use.   Steele Sizer, MD: PCP    Past Medical History:  Diagnosis Date  . Abnormal mammogram   . Allergy   . Hyperlipidemia   . Left shoulder pain   . Osteopenia   . Ovarian failure   . Vaginal atrophy     Patient Active Problem List   Diagnosis Date Noted  . Senile purpura ([place]) 03/07/2018  . Elevated C-reactive protein 07/28/2017  . Allergic rhinitis 06/10/2015  . Dyslipidemia 06/10/2015  . Osteopenia 06/10/2015  . Pain  in shoulder 06/10/2015  . Atrophy of vagina 06/10/2015  . History of diverticulitis 06/10/2015    Past Surgical History:  Procedure Laterality Date  . BREAST BIOPSY Right 03-06-13   RIGHT BREAST AT 10:30, intramammary lymph node  . BREAST BIOPSY Right    neg  . CHOLECYSTECTOMY    . COLONOSCOPY    . TONSILLECTOMY    . TUBAL LIGATION       No current facility-administered medications for this encounter.   Current Outpatient Medications:  .  alendronate (FOSAMAX) 35 MG tablet, Take 1 tablet (35 mg total) by mouth every 7 (seven) days. Take with a full glass of water on an empty stomach., Disp: 12 tablet, Rfl: 3 .  loratadine (CLARITIN) 10 MG tablet, Take 1 tablet (10 mg total) by mouth daily., Disp: 100 tablet, Rfl: 0 .  rosuvastatin (CRESTOR) 40 MG tablet, Take 1 tablet (40 mg total) by mouth daily., Disp: 90 tablet, Rfl: 1 .  valACYclovir (VALTREX) 1000 MG tablet, Take 1 tablet (1,000 mg total) by mouth 2 (two) times daily., Disp: 6 tablet, Rfl: 0 .  Vitamin D, Ergocalciferol, (DRISDOL) 1.25 MG (50000 UT) CAPS capsule, Take 1 capsule (50,000 Units total) by mouth every 7 (seven) days., Disp: 12 capsule, Rfl: 0 .  cephALEXin (KEFLEX) 500 MG capsule, Take 1 capsule (500 mg total) by mouth 2 (two) times daily for 7 days., Disp: 14 capsule, Rfl: 0 .  fluconazole (DIFLUCAN) 150 MG tablet, Take 1 tablet (150 mg  total) by mouth daily. Take one pill orally, Disp: 1 tablet, Rfl: 0  Allergies Lac bovis and Orange juice [orange oil]  Family History  Problem Relation Age of Onset  . Hearing loss Mother   . Migraines Mother   . Heart disease Father   . Heart disease Brother   . Cancer Maternal Aunt        breast  . Breast cancer Maternal Aunt   . Colon cancer Other   . Cancer Other        Colon-Niece  . Hyperlipidemia Sister     Social History Social History   Tobacco Use  . Smoking status: Never Smoker  . Smokeless tobacco: Never Used  Substance Use Topics  . Alcohol use: No     Comment: very seldom   . Drug use: No    Review of Systems Constitutional: No fever ENT: No sore throat. Cardiovascular: Denies chest pain. Respiratory: Denies shortness of breath. Gastrointestinal: As above.  Genitourinary: Negative for dysuria. Musculoskeletal: Negative for back pain. Skin: Negative for rash. Neurological: Denies unilateral weakness or paresthesias.    ____________________________________________   PHYSICAL EXAM:  VITAL SIGNS: ED Triage Vitals  Enc Vitals Group     BP 05/31/19 1513 (!) 150/84     Pulse Rate 05/31/19 1513 (!) 107     Resp 05/31/19 1513 18     Temp 05/31/19 1513 98.3 F (36.8 C)     Temp Source 05/31/19 1513 Oral     SpO2 05/31/19 1513 99 %     Weight 05/31/19 1510 204 lb (92.5 kg)     Height --      Head Circumference --      Peak Flow --      Pain Score --      Pain Loc --      Pain Edu? --      Excl. in [place]? --     Constitutional: Alert and oriented. Well appearing and in no acute distress. Eyes: Conjunctivae are normal.  ENT      Head: Normocephalic  Cardiovascular: Normal rate, regular rhythm. Grossly normal heart sounds.  Good peripheral circulation. Respiratory: Normal respiratory effort without tachypnea nor retractions. Breath sounds are clear and equal bilaterally. No wheezes, rales, rhonchi. Gastrointestinal: Soft and nontender. No distention. Normal Bowel sounds.  Musculoskeletal: Steady gait.  Neurologic:  Normal speech and language. Speech is normal. No gait instability.  Skin:  Skin is warm, dry and intact. No rash noted. Psychiatric: Mood and affect are normal. Speech and behavior are normal. Patient exhibits appropriate insight and judgment   ___________________________________________   LABS (all labs ordered are listed, but only abnormal results are displayed)  Labs Reviewed  CBC WITH DIFFERENTIAL/PLATELET - Abnormal; Notable for the following components:      Result Value   WBC 11.1 (*)    Neutro Abs  7.8 (*)    Monocytes Absolute 1.1 (*)    Abs Immature Granulocytes 0.18 (*)    All other components within normal limits  BASIC METABOLIC PANEL - Abnormal; Notable for the following components:   Sodium 131 (*)    Potassium 3.3 (*)    Chloride 92 (*)    Glucose, Bld 132 (*)    All other components within normal limits  URINALYSIS, COMPLETE (UACMP) WITH MICROSCOPIC - Abnormal; Notable for the following components:   APPearance CLOUDY (*)    Hgb urine dipstick SMALL (*)    Bilirubin Urine SMALL (*)    Ketones, ur  40 (*)    Protein, ur 100 (*)    Nitrite POSITIVE (*)    Leukocytes,Ua LARGE (*)    Bacteria, UA MANY (*)    All other components within normal limits  NOVEL CORONAVIRUS, NAA (HOSP ORDER, SEND-OUT TO REF LAB; TAT 18-24 HRS)  URINE CULTURE     PROCEDURES Procedures    INITIAL IMPRESSION / ASSESSMENT AND PLAN / ED COURSE  Pertinent labs & imaging results that were available during my care of the patient were reviewed by me and considered in my medical decision making (see chart for details).  Well-appearing patient.  No acute distress.  Abdominal pain for 2 days after eating food in which she reported tasted off, and reports abdominal pain has resolved but has had diarrhea.  Diarrhea improved but 1 other episode today.  No fevers.  Nonbloody.  Labs reviewed and discussed with patient.  Suspect most likely viral or food related.  Abdomen soft and nontender.  Discussed brat diet.  Will test GI panel.  Potassium 3.3, 20 mEq potassium given orally once in urgent care.  Urinalysis reviewed, UTI, will treat with Keflex and Diflucan tablets sent.  Encourage rest, fluids, supportive care.Discussed indication, risks and benefits of medications with patient.  COVID-19 testing also completed and advice given.  Discussed follow up with Primary care physician this week. Discussed follow up and return parameters including no resolution or any worsening concerns. Patient verbalized  understanding and agreed to plan.   ____________________________________________   FINAL CLINICAL IMPRESSION(S) / ED DIAGNOSES  Final diagnoses:  Diarrhea, unspecified type  Advice given about COVID-19 virus infection  Hypokalemia  Urinary tract infection with hematuria, site unspecified     ED Discharge Orders         Ordered    cephALEXin (KEFLEX) 500 MG capsule  2 times daily     05/31/19 1646    fluconazole (DIFLUCAN) 150 MG tablet  Daily     05/31/19 1646           Note: This dictation was prepared with Dragon dictation along with smaller phrase technology. Any transcriptional errors that result from this process are unintentional.           Marylene Land, NP 05/31/19 1653

## 2019-05-31 NOTE — Discharge Instructions (Addendum)
BRAT diet. Rest. Drink plenty of fluids. Return stool samples as directed.   Follow up with your primary care physician this week as needed. Return to Urgent care for new or worsening concerns.

## 2019-05-31 NOTE — ED Triage Notes (Signed)
Patient in office today c/o questionable food poison.  Had fish at a restaurant on Oct.10  Sx diarrhea In the beginning abd. Pain only OTC: gas-x

## 2019-06-01 LAB — NOVEL CORONAVIRUS, NAA (HOSP ORDER, SEND-OUT TO REF LAB; TAT 18-24 HRS): SARS-CoV-2, NAA: NOT DETECTED

## 2019-06-03 ENCOUNTER — Telehealth (HOSPITAL_COMMUNITY): Payer: Self-pay | Admitting: Emergency Medicine

## 2019-06-03 LAB — URINE CULTURE: Culture: >=100000 — AB

## 2019-06-03 NOTE — Telephone Encounter (Signed)
Urine culture was positive for ESCHERICHIA COLI  and was given keflex  at urgent care visit. Negative covid. Attempted to reach patient. No answer at this time.

## 2019-06-10 ENCOUNTER — Encounter: Payer: Self-pay | Admitting: Family Medicine

## 2019-06-10 ENCOUNTER — Ambulatory Visit (INDEPENDENT_AMBULATORY_CARE_PROVIDER_SITE_OTHER): Payer: Medicare Other | Admitting: Family Medicine

## 2019-06-10 ENCOUNTER — Other Ambulatory Visit: Payer: Self-pay

## 2019-06-10 VITALS — Temp 98.5°F

## 2019-06-10 DIAGNOSIS — E876 Hypokalemia: Secondary | ICD-10-CM

## 2019-06-10 DIAGNOSIS — N3090 Cystitis, unspecified without hematuria: Secondary | ICD-10-CM | POA: Diagnosis not present

## 2019-06-10 DIAGNOSIS — K529 Noninfective gastroenteritis and colitis, unspecified: Secondary | ICD-10-CM

## 2019-06-10 DIAGNOSIS — D72829 Elevated white blood cell count, unspecified: Secondary | ICD-10-CM

## 2019-06-10 NOTE — Progress Notes (Signed)
Name: Vanessa Hunter   MRN: [id number]    DOB: 1945-10-16   Date:06/10/2019       Progress Note  Subjective  Chief Complaint  Chief Complaint  Patient presents with  . Diarrhea    Has been going on since October 10 and COVID test was negative  . Abdominal Pain    Stomach has felt tender in the middle. States she has had no diarrhea today. Last Saturday went to Urgent Care and Potassium was low.   . Numbness    feeling in bilateral arms and head. Has been drinking pedialyte, ginger ale and alot of water.  . Follow-up    Urgent Care found she had a UTI took all of the antibiotics and finished yesterday    I connected with  Vanessa Hunter on 06/10/19 at  3:00 PM EDT by telephone and verified that I am speaking with the correct person using two identifiers.  I discussed the limitations, risks, security and privacy concerns of performing an evaluation and management service by telephone and the availability of in person appointments. Staff also discussed with the patient that there may be a patient responsible charge related to this service. Patient Location: at home  Provider Location: [facility]   HPI  Gastroenteritis and UTI: Vanessa Hunter states she had a GI bug, she was having loose to watery stools, abdominal cramping after going out to eat at [employer] on 10/10. She never had fever, chills, nausea, vomiting, blood or mucus on her stools. However developed some numbness and weakness and went to Urgent Care on 05/31/2019. She was found to have elevated WBC, hypokalemia and E. Coli UTI. She never had dysuria. She states last episode of diarrhea was yesterday. She is still trying to stay hydrated. Reviewed labs with her. We will recheck lab work and urine culture. She was advised to continue hydration and monitor bp since her bp was elevated during urgent care visit  Patient Active Problem List   Diagnosis Date Noted  . Senile purpura ([place]) 03/07/2018  . Elevated  C-reactive protein 07/28/2017  . Allergic rhinitis 06/10/2015  . Dyslipidemia 06/10/2015  . Osteopenia 06/10/2015  . Pain in shoulder 06/10/2015  . Atrophy of vagina 06/10/2015  . History of diverticulitis 06/10/2015    Past Surgical History:  Procedure Laterality Date  . BREAST BIOPSY Right 03-06-13   RIGHT BREAST AT 10:30, intramammary lymph node  . BREAST BIOPSY Right    neg  . CHOLECYSTECTOMY    . COLONOSCOPY    . TONSILLECTOMY    . TUBAL LIGATION      Family History  Problem Relation Age of Onset  . Hearing loss Mother   . Migraines Mother   . Heart disease Father   . Heart disease Brother   . Cancer Maternal Aunt        breast  . Breast cancer Maternal Aunt   . Colon cancer Other   . Cancer Other        Colon-Niece  . Hyperlipidemia Sister     Social History   Socioeconomic History  . Marital status: Widowed    Spouse name: Not on file  . Number of children: 1  . Years of education: Not on file  . Highest education level: Bachelor's degree (e.g., BA, AB, BS)  Occupational History  . 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 Financial resource strain: Not hard at all  .  Food insecurity    Worry: Never true    Inability: Never true  . Transportation needs    Medical: No    Non-medical: No  Tobacco Use  . Smoking status: Never Smoker  . Smokeless tobacco: Never Used  Substance and Sexual Activity  . Alcohol use: No    Comment: very seldom   . Drug use: No  . Sexual activity: Yes  Lifestyle  . Physical activity    Days per week: 3 days    Minutes per session: 60 min  . Stress: To some extent  Relationships  . Social connections    Talks on phone: More than three times a week    Gets together: More than three times a week    Attends religious service: More than 4 times per year    Active member of club or organization: Yes    Attends meetings of clubs or organizations: More than 4 times per year     Relationship status: Widowed  . Intimate partner violence    Fear of current or ex partner: No    Emotionally abused: No    Physically abused: No    Forced sexual activity: No  Other Topics Concern  . Not on file  Social History Narrative   She is dating Berneta Sages since July 2017. They have know each other for a long time, but started  Summer 2017.    Son lives in [place].   She is originally from [place], moved to [place] and after retirement moved with husband to [place], but once her husband died in 2009/12/03 she moved back to [place] in 12-04-11   Mother still living and also sister are here in [place].    She has two grandson's      Current Outpatient Medications:  .  alendronate (FOSAMAX) 35 MG tablet, Take 1 tablet (35 mg total) by mouth every 7 (seven) days. Take with a full glass of water on an empty stomach., Disp: 12 tablet, Rfl: 3 .  loratadine (CLARITIN) 10 MG tablet, Take 1 tablet (10 mg total) by mouth daily., Disp: 100 tablet, Rfl: 0 .  rosuvastatin (CRESTOR) 40 MG tablet, Take 1 tablet (40 mg total) by mouth daily., Disp: 90 tablet, Rfl: 1 .  valACYclovir (VALTREX) 1000 MG tablet, Take 1 tablet (1,000 mg total) by mouth 2 (two) times daily., Disp: 6 tablet, Rfl: 0 .  Vitamin D, Ergocalciferol, (DRISDOL) 1.25 MG (50000 UT) CAPS capsule, Take 1 capsule (50,000 Units total) by mouth every 7 (seven) days., Disp: 12 capsule, Rfl: 0 .  fluconazole (DIFLUCAN) 150 MG tablet, Take 1 tablet (150 mg total) by mouth daily. Take one pill orally (Patient not taking: Reported on 06/10/2019), Disp: 1 tablet, Rfl: 0  Allergies  Allergen Reactions  . Lac Bovis     lactose intolerance  . Orange Juice [Orange Oil] Itching    I personally reviewed active problem list, medication list, allergies, family history, social history, health maintenance with the patient/caregiver today.   ROS  Ten systems reviewed and is negative except as mentioned in HPI   Objective  Virtual encounter, vitals not obtained.  Today's  Vitals   06/10/19 1512  Temp: 98.5 F (36.9 C)  TempSrc: Temporal    There is no height or weight on file to calculate BMI.  Physical Exam    Awake, alert and oriented   PHQ2/9: Depression screen [facility] 2/9 06/10/2019 05/29/2019 03/12/2019 02/17/2019 11/08/2018  Decreased Interest 0 0 0 0 0  Down,  Depressed, Hopeless 0 0 0 0 0  PHQ - 2 Score 0 0 0 0 0  Altered sleeping 0 0 0 0 0  Tired, decreased energy 0 0 0 0 0  Change in appetite 0 0 0 0 0  Feeling bad or failure about yourself  0 0 0 0 0  Trouble concentrating 0 0 0 0 0  Moving slowly or fidgety/restless 0 0 0 0 0  Suicidal thoughts 0 0 0 0 0  PHQ-9 Score 0 0 0 0 0  Difficult doing work/chores Not difficult at all Not difficult at all Not difficult at all Not difficult at all Not difficult at all   PHQ-2/9 Result is negative.    Fall Risk: Fall Risk  06/10/2019 05/29/2019 03/12/2019 02/17/2019 11/08/2018  Falls in the past year? 0 0 0 0 0  Number falls in past yr: 0 0 0 0 0  Injury with Fall? 0 0 0 0 0  Follow up - - - - -     Assessment & Plan  1. Leukocytosis, unspecified type  - CBC with Differential/Platelet; Future  2. Hypokalemia  - Magnesium; Future - Basic metabolic panel; Future  3. Gastroenteritis  resolved  4. Cystitis  - Urine Culture; Future  I discussed the assessment and treatment plan with the patient. The patient was provided an opportunity to ask questions and all were answered. The patient agreed with the plan and demonstrated an understanding of the instructions.   The patient was advised to call back or seek an in-person evaluation if the symptoms worsen or if the condition fails to improve as anticipated.  I provided 15  minutes of non-face-to-face time during this encounter.  Loistine Chance, MD

## 2019-06-13 ENCOUNTER — Other Ambulatory Visit: Payer: Self-pay

## 2019-06-13 ENCOUNTER — Other Ambulatory Visit
Admission: RE | Admit: 2019-06-13 | Discharge: 2019-06-13 | Disposition: A | Discharge status: Home / Self Care | Payer: Medicare Other | Attending: Family Medicine | Admitting: Family Medicine

## 2019-06-13 DIAGNOSIS — N3090 Cystitis, unspecified without hematuria: Secondary | ICD-10-CM | POA: Diagnosis not present

## 2019-06-13 DIAGNOSIS — E876 Hypokalemia: Secondary | ICD-10-CM | POA: Diagnosis not present

## 2019-06-13 DIAGNOSIS — D72829 Elevated white blood cell count, unspecified: Secondary | ICD-10-CM | POA: Diagnosis not present

## 2019-06-13 LAB — BASIC METABOLIC PANEL
Anion gap: 13 (ref 5–15)
BUN: 7 mg/dL — ABNORMAL LOW (ref 8–23)
CO2: 24 mmol/L (ref 22–32)
Calcium: 7.4 mg/dL — ABNORMAL LOW (ref 8.9–10.3)
Chloride: 100 mmol/L (ref 98–111)
Creatinine, Ser: 0.79 mg/dL (ref 0.44–1.00)
GFR calc Af Amer: >60 mL/min (ref >60)
GFR calc non Af Amer: >60 mL/min (ref >60)
Glucose, Bld: 112 mg/dL — ABNORMAL HIGH (ref 70–99)
Potassium: 3.4 mmol/L — ABNORMAL LOW (ref 3.5–5.1)
Sodium: 137 mmol/L (ref 135–145)

## 2019-06-13 LAB — CBC WITH DIFFERENTIAL/PLATELET
Abs Immature Granulocytes: 0.08 10*3/uL — ABNORMAL HIGH (ref 0.00–0.07)
Basophils Absolute: 0.1 10*3/uL (ref 0.0–0.1)
Basophils Relative: 1 %
Eosinophils Absolute: 0.2 10*3/uL (ref 0.0–0.5)
Eosinophils Relative: 2 %
HCT: 36.3 % (ref 36.0–46.0)
Hemoglobin: 12.3 g/dL (ref 12.0–15.0)
Immature Granulocytes: 1 %
Lymphocytes Relative: 13 %
Lymphs Abs: 1.7 10*3/uL (ref 0.7–4.0)
MCH: 30.2 pg (ref 26.0–34.0)
MCHC: 33.9 g/dL (ref 30.0–36.0)
MCV: 89.2 fL (ref 80.0–100.0)
Monocytes Absolute: 0.9 10*3/uL (ref 0.1–1.0)
Monocytes Relative: 7 %
Neutro Abs: 9.8 10*3/uL — ABNORMAL HIGH (ref 1.7–7.7)
Neutrophils Relative %: 76 %
Platelets: 376 10*3/uL (ref 150–400)
RBC: 4.07 MIL/uL (ref 3.87–5.11)
RDW: 12 % (ref 11.5–15.5)
WBC: 12.7 10*3/uL — ABNORMAL HIGH (ref 4.0–10.5)
nRBC: 0 % (ref 0.0–0.2)

## 2019-06-13 LAB — MAGNESIUM: Magnesium: 1.1 mg/dL — ABNORMAL LOW (ref 1.7–2.4)

## 2019-06-15 ENCOUNTER — Other Ambulatory Visit: Payer: Self-pay | Admitting: Family Medicine

## 2019-06-15 DIAGNOSIS — N3000 Acute cystitis without hematuria: Secondary | ICD-10-CM

## 2019-06-16 ENCOUNTER — Other Ambulatory Visit: Payer: Self-pay | Admitting: Family Medicine

## 2019-06-16 ENCOUNTER — Telehealth: Payer: Self-pay

## 2019-06-16 DIAGNOSIS — E876 Hypokalemia: Secondary | ICD-10-CM

## 2019-06-16 DIAGNOSIS — D72829 Elevated white blood cell count, unspecified: Secondary | ICD-10-CM

## 2019-06-16 DIAGNOSIS — N3000 Acute cystitis without hematuria: Secondary | ICD-10-CM

## 2019-06-16 DIAGNOSIS — R790 Abnormal level of blood mineral: Secondary | ICD-10-CM

## 2019-06-16 LAB — URINE CULTURE: Culture: >=100000 — AB

## 2019-06-16 MED ORDER — NITROFURANTOIN MONOHYD MACRO 100 MG PO CAPS: 100.0000 mg | ORAL_CAPSULE | Freq: Two times a day (BID) | ORAL | 0 refills | Status: DC

## 2019-06-16 MED ORDER — MAGNESIUM OXIDE 400 MG PO CAPS: 1.0000 | ORAL_CAPSULE | Freq: Two times a day (BID) | ORAL | 0 refills | Status: DC

## 2019-06-16 NOTE — Telephone Encounter (Signed)
Copied from [place] [id number]. Topic: General - Other >> Jun 16, 2019  8:05 AM Rayann Heman wrote: Reason for CRM: pt called and stated that she missed a call from Dr Ancil Boozer regarding blood work and would like a call back.

## 2019-06-23 ENCOUNTER — Other Ambulatory Visit: Payer: Self-pay | Admitting: Family Medicine

## 2019-06-30 DIAGNOSIS — R790 Abnormal level of blood mineral: Secondary | ICD-10-CM | POA: Diagnosis not present

## 2019-06-30 DIAGNOSIS — N3000 Acute cystitis without hematuria: Secondary | ICD-10-CM | POA: Diagnosis not present

## 2019-06-30 DIAGNOSIS — E876 Hypokalemia: Secondary | ICD-10-CM | POA: Diagnosis not present

## 2019-06-30 DIAGNOSIS — D72829 Elevated white blood cell count, unspecified: Secondary | ICD-10-CM | POA: Diagnosis not present

## 2019-07-02 LAB — CBC WITH DIFFERENTIAL/PLATELET
Absolute Monocytes: 585 cells/uL (ref 200–950)
Basophils Absolute: 70 cells/uL (ref 0–200)
Basophils Relative: 0.9 %
Eosinophils Absolute: 156 cells/uL (ref 15–500)
Eosinophils Relative: 2 %
HCT: 37.3 % (ref 35.0–45.0)
Hemoglobin: 12.4 g/dL (ref 11.7–15.5)
Lymphs Abs: 2059 cells/uL (ref 850–3900)
MCH: 29.9 pg (ref 27.0–33.0)
MCHC: 33.2 g/dL (ref 32.0–36.0)
MCV: 89.9 fL (ref 80.0–100.0)
MPV: 11.9 fL (ref 7.5–12.5)
Monocytes Relative: 7.5 %
Neutro Abs: 4930 cells/uL (ref 1500–7800)
Neutrophils Relative %: 63.2 %
Platelets: 361 10*3/uL (ref 140–400)
RBC: 4.15 10*6/uL (ref 3.80–5.10)
RDW: 12.9 % (ref 11.0–15.0)
Total Lymphocyte: 26.4 %
WBC: 7.8 10*3/uL (ref 3.8–10.8)

## 2019-07-02 LAB — BASIC METABOLIC PANEL WITH GFR
BUN: 14 mg/dL (ref 7–25)
CO2: 24 mmol/L (ref 20–32)
Calcium: 8.8 mg/dL (ref 8.6–10.4)
Chloride: 107 mmol/L (ref 98–110)
Creat: 0.6 mg/dL (ref 0.60–0.93)
GFR, Est African American: 106 mL/min/{1.73_m2} (ref >=60)
GFR, Est Non African American: 91 mL/min/{1.73_m2} (ref >=60)
Glucose, Bld: 95 mg/dL (ref 65–99)
Potassium: 4.2 mmol/L (ref 3.5–5.3)
Sodium: 141 mmol/L (ref 135–146)

## 2019-07-02 LAB — URINE CULTURE
MICRO NUMBER:: 1105621
SPECIMEN QUALITY:: ADEQUATE

## 2019-07-02 LAB — MAGNESIUM: Magnesium: 1.7 mg/dL (ref 1.5–2.5)

## 2019-07-04 ENCOUNTER — Telehealth: Payer: Self-pay | Admitting: Family Medicine

## 2019-07-04 NOTE — Telephone Encounter (Signed)
Pt returned call.  Copied from [place] [id number]. Topic: Quick Communication - Lab Results (Clinic Use ONLY) >> Jul 04, 2019  2:58 PM Chilton Greathouse, CMA wrote: Patient called.  Unable to reach patient. If patient calls back please inform her of her most recent lab results.

## 2019-07-07 ENCOUNTER — Telehealth: Payer: Self-pay

## 2019-07-07 NOTE — Telephone Encounter (Signed)
Pt given lab results per notes of Dr. Ancil Boozer on 07/03/19. Pt verbalized understanding.

## 2019-08-21 ENCOUNTER — Ambulatory Visit: Payer: Medicare Other | Admitting: Family Medicine

## 2019-08-22 ENCOUNTER — Other Ambulatory Visit: Payer: Self-pay

## 2019-08-22 ENCOUNTER — Encounter: Payer: Self-pay | Admitting: Family Medicine

## 2019-08-22 ENCOUNTER — Ambulatory Visit (INDEPENDENT_AMBULATORY_CARE_PROVIDER_SITE_OTHER): Payer: Medicare Other | Admitting: Family Medicine

## 2019-08-22 VITALS — Temp 96.5°F | Ht 62.0 in

## 2019-08-22 DIAGNOSIS — R7982 Elevated C-reactive protein (CRP): Secondary | ICD-10-CM

## 2019-08-22 DIAGNOSIS — Z1231 Encounter for screening mammogram for malignant neoplasm of breast: Secondary | ICD-10-CM

## 2019-08-22 DIAGNOSIS — E785 Hyperlipidemia, unspecified: Secondary | ICD-10-CM | POA: Diagnosis not present

## 2019-08-22 DIAGNOSIS — J301 Allergic rhinitis due to pollen: Secondary | ICD-10-CM

## 2019-08-22 DIAGNOSIS — M85852 Other specified disorders of bone density and structure, left thigh: Secondary | ICD-10-CM | POA: Diagnosis not present

## 2019-08-22 DIAGNOSIS — E559 Vitamin D deficiency, unspecified: Secondary | ICD-10-CM

## 2019-08-22 DIAGNOSIS — D692 Other nonthrombocytopenic purpura: Secondary | ICD-10-CM | POA: Diagnosis not present

## 2019-08-22 MED ORDER — VITAMIN D (ERGOCALCIFEROL) 1.25 MG (50000 UNIT) PO CAPS: 50000.0000 [IU] | ORAL_CAPSULE | ORAL | 0 refills | Status: DC

## 2019-08-22 MED ORDER — ROSUVASTATIN CALCIUM 40 MG PO TABS: 40.0000 mg | ORAL_TABLET | Freq: Every day | ORAL | 1 refills | Status: DC

## 2019-08-22 MED ORDER — ALENDRONATE SODIUM 35 MG PO TABS: 35.0000 mg | ORAL_TABLET | ORAL | 3 refills | Status: DC

## 2019-08-22 NOTE — Progress Notes (Signed)
Name: Vanessa Hunter   MRN: [id number]    DOB: Apr 19, 1946   Date:08/22/2019       Progress Note  Subjective  Chief Complaint  Chief Complaint  Patient presents with  . Follow-up    I connected with  Vanessa Hunter on 08/22/19 at 10:00 AM EST by telephone and verified that I am speaking with the correct person using two identifiers.  I discussed the limitations, risks, security and privacy concerns of performing an evaluation and management service by telephone and the availability of in person appointments. Staff also discussed with the patient that there may be a patient responsible charge related to this service. Patient Location: at home Provider Location: [facility]   HPI  Dyslipidemia: shehas been taking Crestor daily , no side myalgias or any other side effects, lipid panel improved, LDL is down to 120. CRP still elevated, we will check high sensitivity CRP next time we check labs  .   Elevated C-reactive protein: no joint pains, fever or weight loss,stable for a few  Years, we will recheck labs next week   Vitamin D : she takes weekly rx vitamin D , she is still taking supplementation and needs refill   AR: she states she has seasonal allergies , taking otc medication prn, only triggered in the Fall and Spring, doing well at this time. Usually has post-nasal drainage, no itching or sneezing  Senile Purpura: she states stable, still has it frequently but not any worse     Patient Active Problem List   Diagnosis Date Noted  . Senile purpura ([place]) 03/07/2018  . Elevated C-reactive protein 07/28/2017  . Allergic rhinitis 06/10/2015  . Dyslipidemia 06/10/2015  . Osteopenia 06/10/2015  . Pain in shoulder 06/10/2015  . Atrophy of vagina 06/10/2015  . History of diverticulitis 06/10/2015    Past Surgical History:  Procedure Laterality Date  . BREAST BIOPSY Right 03-06-13   RIGHT BREAST AT 10:30, intramammary lymph node  . BREAST BIOPSY Right    neg   . CHOLECYSTECTOMY    . COLONOSCOPY    . TONSILLECTOMY    . TUBAL LIGATION      Family History  Problem Relation Age of Onset  . Hearing loss Mother   . Migraines Mother   . Heart disease Father   . Heart disease Brother   . Cancer Maternal Aunt        breast  . Breast cancer Maternal Aunt   . Colon cancer Other   . Cancer Other        Colon-Niece  . Hyperlipidemia Sister      Current Outpatient Medications:  .  alendronate (FOSAMAX) 35 MG tablet, Take 1 tablet (35 mg total) by mouth every 7 (seven) days. Take with a full glass of water on an empty stomach., Disp: 12 tablet, Rfl: 3 .  loratadine (CLARITIN) 10 MG tablet, Take 1 tablet (10 mg total) by mouth daily., Disp: 100 tablet, Rfl: 0 .  rosuvastatin (CRESTOR) 40 MG tablet, Take 1 tablet (40 mg total) by mouth daily., Disp: 90 tablet, Rfl: 1 .  valACYclovir (VALTREX) 1000 MG tablet, Take 1 tablet (1,000 mg total) by mouth 2 (two) times daily., Disp: 6 tablet, Rfl: 0 .  Vitamin D, Ergocalciferol, (DRISDOL) 1.25 MG (50000 UT) CAPS capsule, Take 1 capsule (50,000 Units total) by mouth every 7 (seven) days., Disp: 12 capsule, Rfl: 0 .  Magnesium Oxide 400 MG CAPS, Take 1 capsule (400 mg total) by mouth  2 (two) times daily. (Patient not taking: Reported on 08/22/2019), Disp: 30 capsule, Rfl: 0 .  nitrofurantoin, macrocrystal-monohydrate, (MACROBID) 100 MG capsule, Take 1 capsule (100 mg total) by mouth 2 (two) times daily. (Patient not taking: Reported on 08/22/2019), Disp: 14 capsule, Rfl: 0  Allergies  Allergen Reactions  . Lac Bovis     lactose intolerance  . Orange Juice [Orange Oil] Itching    I personally reviewed active problem list, medication list, allergies, family history, social history, health maintenance with the patient/caregiver today.   ROS  Ten systems reviewed and is negative except as mentioned in HPI   Objective  Virtual encounter, vitals not obtained.  Body mass index is 37.31 kg/m.  Physical  Exam  Awake, alert and oriented  PHQ2/9: Depression screen [facility] 2/9 08/22/2019 06/10/2019 05/29/2019 03/12/2019 02/17/2019  Decreased Interest 0 0 0 0 0  Down, Depressed, Hopeless 0 0 0 0 0  PHQ - 2 Score 0 0 0 0 0  Altered sleeping 0 0 0 0 0  Tired, decreased energy 0 0 0 0 0  Change in appetite 0 0 0 0 0  Feeling bad or failure about yourself  0 0 0 0 0  Trouble concentrating 0 0 0 0 0  Moving slowly or fidgety/restless 0 0 0 0 0  Suicidal thoughts 0 0 0 0 0  PHQ-9 Score 0 0 0 0 0  Difficult doing work/chores Not difficult at all Not difficult at all Not difficult at all Not difficult at all Not difficult at all   PHQ-2/9 Result is negative.    Fall Risk: Fall Risk  08/22/2019 06/10/2019 05/29/2019 03/12/2019 02/17/2019  Falls in the past year? 0 0 0 0 0  Number falls in past yr: 0 0 0 0 0  Injury with Fall? 0 0 0 0 0  Follow up - - - - -     Assessment & Plan  1. Dyslipidemia  - C-reactive protein - Lipid panel - CRP High sensitivity - rosuvastatin (CRESTOR) 40 MG tablet; Take 1 tablet (40 mg total) by mouth daily.  Dispense: 90 tablet; Refill: 1  2. Seasonal allergic rhinitis   3. Vitamin D deficiency  - VITAMIN D 25 Hydroxy (Vit-D Deficiency, Fractures) - Vitamin D, Ergocalciferol, (DRISDOL) 1.25 MG (50000 UT) CAPS capsule; Take 1 capsule (50,000 Units total) by mouth every 7 (seven) days.  Dispense: 12 capsule; Refill: 0  4. Osteopenia of neck of left femur  - COMPLETE METABOLIC PANEL WITH GFR - VITAMIN D 25 Hydroxy (Vit-D Deficiency, Fractures) - alendronate (FOSAMAX) 35 MG tablet; Take 1 tablet (35 mg total) by mouth every 7 (seven) days. Take with a full glass of water on an empty stomach.  Dispense: 12 tablet; Refill: 3  5. Senile purpura ([facility])  stable  6. Elevated C-reactive protein  - C-reactive protein - CRP High sensitivity  7. Encounter for screening mammogram for malignant neoplasm of breast  - MM Digital Screening; Future  I discussed the  assessment and treatment plan with the patient. The patient was provided an opportunity to ask questions and all were answered. The patient agreed with the plan and demonstrated an understanding of the instructions.   The patient was advised to call back or seek an in-person evaluation if the symptoms worsen or if the condition fails to improve as anticipated.  I provided 25  minutes of non-face-to-face time during this encounter.  Loistine Chance, MD

## 2019-09-17 DIAGNOSIS — M85852 Other specified disorders of bone density and structure, left thigh: Secondary | ICD-10-CM | POA: Diagnosis not present

## 2019-09-17 DIAGNOSIS — R7982 Elevated C-reactive protein (CRP): Secondary | ICD-10-CM | POA: Diagnosis not present

## 2019-09-17 DIAGNOSIS — E559 Vitamin D deficiency, unspecified: Secondary | ICD-10-CM | POA: Diagnosis not present

## 2019-09-17 DIAGNOSIS — E785 Hyperlipidemia, unspecified: Secondary | ICD-10-CM | POA: Diagnosis not present

## 2019-09-18 LAB — COMPLETE METABOLIC PANEL WITH GFR
AG Ratio: 1.1 (calc) (ref 1.0–2.5)
ALT: 10 U/L (ref 6–29)
AST: 16 U/L (ref 10–35)
Albumin: 4 g/dL (ref 3.6–5.1)
Alkaline phosphatase (APISO): 72 U/L (ref 37–153)
BUN: 11 mg/dL (ref 7–25)
CO2: 27 mmol/L (ref 20–32)
Calcium: 9 mg/dL (ref 8.6–10.4)
Chloride: 105 mmol/L (ref 98–110)
Creat: 0.72 mg/dL (ref 0.60–0.93)
GFR, Est African American: 96 mL/min/{1.73_m2} (ref >=60)
GFR, Est Non African American: 83 mL/min/{1.73_m2} (ref >=60)
Globulin: 3.7 g/dL (calc) (ref 1.9–3.7)
Glucose, Bld: 102 mg/dL — ABNORMAL HIGH (ref 65–99)
Potassium: 3.4 mmol/L — ABNORMAL LOW (ref 3.5–5.3)
Sodium: 142 mmol/L (ref 135–146)
Total Bilirubin: 0.4 mg/dL (ref 0.2–1.2)
Total Protein: 7.7 g/dL (ref 6.1–8.1)

## 2019-09-18 LAB — LIPID PANEL
Cholesterol: 244 mg/dL — ABNORMAL HIGH (ref <200)
HDL: 64 mg/dL (ref >=50)
LDL Cholesterol (Calc): 157 mg/dL (calc) — ABNORMAL HIGH
Non-HDL Cholesterol (Calc): 180 mg/dL (calc) — ABNORMAL HIGH (ref <130)
Total CHOL/HDL Ratio: 3.8 (calc) (ref <5.0)
Triglycerides: 115 mg/dL (ref <150)

## 2019-09-18 LAB — VITAMIN D 25 HYDROXY (VIT D DEFICIENCY, FRACTURES): Vit D, 25-Hydroxy: 33 ng/mL (ref 30–100)

## 2019-09-18 LAB — HIGH SENSITIVITY CRP: hs-CRP: >10 mg/L — ABNORMAL HIGH

## 2019-09-18 LAB — C-REACTIVE PROTEIN: CRP: 12.2 mg/L — ABNORMAL HIGH (ref <8.0)

## 2019-09-19 ENCOUNTER — Ambulatory Visit (INDEPENDENT_AMBULATORY_CARE_PROVIDER_SITE_OTHER): Payer: Medicare Other

## 2019-09-19 VITALS — Temp 97.8°F

## 2019-09-19 DIAGNOSIS — Z0000 Encounter for general adult medical examination without abnormal findings: Secondary | ICD-10-CM

## 2019-09-19 DIAGNOSIS — Z780 Asymptomatic menopausal state: Secondary | ICD-10-CM | POA: Diagnosis not present

## 2019-09-19 DIAGNOSIS — M8580 Other specified disorders of bone density and structure, unspecified site: Secondary | ICD-10-CM | POA: Diagnosis not present

## 2019-09-19 NOTE — Progress Notes (Signed)
Subjective:   Vanessa Hunter is a 74 y.o. female who presents for Medicare Annual (Subsequent) preventive examination.  Virtual Visit via Telephone Note  I connected with Vanessa Hunter on 09/19/19 at 10:00 AM EST by telephone and verified that I am speaking with the correct person using two identifiers.  Medicare Annual Wellness visit completed telephonically due to Covid-19 pandemic.   Location: Patient: home Provider: office   I discussed the limitations, risks, security and privacy concerns of performing an evaluation and management service by telephone and the availability of in person appointments. The patient expressed understanding and agreed to proceed.  Some vital signs may be absent or patient reported.   Clemetine Marker, LPN    Review of Systems:   Cardiac Risk Factors include: advanced age (>51men, >41 women);dyslipidemia;obesity (BMI >30kg/m2)     Objective:     Vitals: Temp 97.8 F (36.6 C)   There is no height or weight on file to calculate BMI.  Advanced Directives 09/19/2019 09/13/2018 11/22/2016 07/19/2016 06/10/2015  Does Patient Have a Medical Advance Directive? Yes Yes Yes Yes Yes  Type of Paramedic of [place];Living will Living will Living will;Healthcare Power of Attorney Living will [place];Living will  Copy of [place] in Chart? No - copy requested - No - copy requested - No - copy requested    Tobacco Social History   Tobacco Use  Smoking Status Never Smoker  Smokeless Tobacco Never Used     Counseling given: Not Answered   Clinical Intake:  Pre-visit preparation completed: Yes  Pain : No/denies pain     Nutritional Risks: None Diabetes: No  How often do you need to have someone help you when you read instructions, pamphlets, or other written materials from your doctor or pharmacy?: 1 - Never  Interpreter Needed?: No  Information entered by :: Clemetine Marker LPN  Past  Medical History:  Diagnosis Date  . Abnormal mammogram   . Allergy   . Hyperlipidemia   . Left shoulder pain   . Osteopenia   . Ovarian failure   . Vaginal atrophy    Past Surgical History:  Procedure Laterality Date  . BREAST BIOPSY Right 03-06-13   RIGHT BREAST AT 10:30, intramammary lymph node  . BREAST BIOPSY Right    neg  . CHOLECYSTECTOMY    . COLONOSCOPY    . TONSILLECTOMY    . TUBAL LIGATION     Family History  Problem Relation Age of Onset  . Hearing loss Mother   . Migraines Mother   . Heart disease Father   . Heart disease Brother   . Cancer Maternal Aunt        breast  . Breast cancer Maternal Aunt   . Colon cancer Other   . Cancer Other        Colon-Niece  . Hyperlipidemia Sister    Social History   Socioeconomic History  . Marital status: Widowed    Spouse name: Not on file  . Number of children: 1  . Years of education: Not on file  . Highest education level: Bachelor's degree (e.g., BA, AB, BS)  Occupational History  . 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 Smoking status: Never Smoker  . Smokeless tobacco: Never Used  Substance and Sexual Activity  . Alcohol use: No    Comment: very seldom   . Drug use: No  .  Sexual activity: Yes  Other Topics Concern  . Not on file  Social History Narrative   She is dating Berneta Sages since July 2017. They have know each other for a long time, but started  Summer 2017.    Son lives in [place].   She is originally from [place], moved to [place] and after retirement moved with husband to [place], but once her husband died in 17-Nov-2009 she moved back to [place] in 18-Nov-2011   Mother still living and also sister are here in [place].    She has two grandson's    Social Determinants of Health   Financial Resource Strain: Low Risk   . Difficulty of Paying Living Expenses: Not hard at all  Food Insecurity: No Food Insecurity  . Worried About Charity fundraiser in the Last Year: Never true  . Ran  Out of Food in the Last Year: Never true  Transportation Needs: No Transportation Needs  . Lack of Transportation (Medical): No  . Lack of Transportation (Non-Medical): No  Physical Activity: Sufficiently Active  . Days of Exercise per Week: 3 days  . Minutes of Exercise per Session: 60 min  Stress: No Stress Concern Present  . Feeling of Stress : Only a little  Social Connections: Slightly Isolated  . Frequency of Communication with Friends and Family: More than three times a week  . Frequency of Social Gatherings with Friends and Family: More than three times a week  . Attends Religious Services: More than 4 times per year  . Active Member of Clubs or Organizations: Yes  . Attends Archivist Meetings: More than 4 times per year  . Marital Status: Widowed    Outpatient Encounter Medications as of 09/19/2019  Medication Sig  . alendronate (FOSAMAX) 35 MG tablet Take 1 tablet (35 mg total) by mouth every 7 (seven) days. Take with a full glass of water on an empty stomach.  . loratadine (CLARITIN) 10 MG tablet Take 1 tablet (10 mg total) by mouth daily.  . rosuvastatin (CRESTOR) 40 MG tablet Take 1 tablet (40 mg total) by mouth daily.  . Vitamin D, Ergocalciferol, (DRISDOL) 1.25 MG (50000 UT) CAPS capsule Take 1 capsule (50,000 Units total) by mouth every 7 (seven) days.  . valACYclovir (VALTREX) 1000 MG tablet Take 1 tablet (1,000 mg total) by mouth 2 (two) times daily. (Patient not taking: Reported on 09/19/2019)   No facility-administered encounter medications on file as of 09/19/2019.    Activities of Daily Living In your present state of health, do you have any difficulty performing the following activities: 09/19/2019 08/22/2019  Hearing? N N  Comment declines hearing aids -  Vision? N N  Difficulty concentrating or making decisions? N N  Walking or climbing stairs? N N  Dressing or bathing? N N  Doing errands, shopping? N N  Preparing Food and eating ? N -  Using the  Toilet? N -  In the past six months, have you accidently leaked urine? N -  Do you have problems with loss of bowel control? N -  Managing your Medications? N -  Managing your Finances? N -  Housekeeping or managing your Housekeeping? N -  Some recent data might be hidden    Patient Care Team: Steele Sizer, MD as PCP - General (Family Medicine) Bary Castilla, Forest Gleason, MD (General Surgery) Steele Sizer, MD as Attending Physician (Family Medicine)    Assessment:   This is a routine wellness examination for [facility].  Exercise Activities  and Dietary recommendations Current Exercise Habits: Home exercise routine, Type of exercise: walking;calisthenics, Time (Minutes): 60, Frequency (Times/Week): 3, Weekly Exercise (Minutes/Week): 180, Intensity: Mild, Exercise limited by: None identified  Goals    . Weight (lb) < 200 lb (90.7 kg)     Pt would like to lose weight over the next year        Fall Risk Fall Risk  09/19/2019 08/22/2019 06/10/2019 05/29/2019 03/12/2019  Falls in the past year? 0 0 0 0 0  Number falls in past yr: 0 0 0 0 0  Injury with Fall? 0 0 0 0 0  Risk for fall due to : No Fall Risks - - - -  Follow up Falls prevention discussed - - - -   FALL RISK PREVENTION PERTAINING TO THE HOME:  Any stairs in or around the home? Yes  If so, do they handrails? Yes  Home free of loose throw rugs in walkways, pet beds, electrical cords, etc? Yes  Adequate lighting in your home to reduce risk of falls? Yes   ASSISTIVE DEVICES UTILIZED TO PREVENT FALLS:  Life alert? No  Use of a cane, walker or w/c? No  Grab bars in the bathroom? Yes  Shower chair or bench in shower? No  Elevated toilet seat or a handicapped toilet? No   DME ORDERS:  DME order needed?  No   TIMED UP AND GO:  Was the test performed? No . Telephonic visit.  Depression Screen PHQ 2/9 Scores 09/19/2019 08/22/2019 06/10/2019 05/29/2019  PHQ - 2 Score 0 0 0 0  PHQ- 9 Score - 0 0 0     Cognitive Function      6CIT Screen 09/19/2019 09/13/2018  What Year? 0 points 0 points  What month? 0 points 0 points  What time? 0 points 0 points  Count back from 20 0 points 0 points  Months in reverse 0 points 0 points  Repeat phrase 0 points 2 points  Total Score 0 2    Immunization History  Administered Date(s) Administered  . Pneumococcal Conjugate-13 07/19/2016  . Pneumococcal Polysaccharide-23 01/28/2013    Qualifies for Shingles Vaccine? Yes . Due for Shingrix. Education has been provided regarding the importance of this vaccine. Pt has been advised to call insurance company to determine out of pocket expense. Advised may also receive vaccine at local pharmacy or Health Dept. Verbalized acceptance and understanding.  Tdap: Although this vaccine is not a covered service during a Wellness Exam, does the patient still wish to receive this vaccine today?  No .  Education has been provided regarding the importance of this vaccine. Advised may receive this vaccine at local pharmacy or Health Dept. Aware to provide a copy of the vaccination record if obtained from local pharmacy or Health Dept. Verbalized acceptance and understanding.  Flu Vaccine: Due for Flu vaccine. Does the patient want to receive this vaccine today?  No . Education has been provided regarding the importance of this vaccine but still declined. Advised may receive this vaccine at local pharmacy or Health Dept. Aware to provide a copy of the vaccination record if obtained from local pharmacy or Health Dept. Verbalized acceptance and understanding.  Pneumococcal Vaccine: Up to date    Screening Tests Health Maintenance  Topic Date Due  . INFLUENZA VACCINE  11/12/2019 (Originally 03/15/2019)  . TETANUS/TDAP  08/21/2020 (Originally 07/30/1965)  . MAMMOGRAM  10/02/2020  . COLONOSCOPY  02/25/2023  . DEXA SCAN  Completed  . Hepatitis C Screening  Completed  . PNA vac Low Risk Adult  Completed    Cancer Screenings:  Colorectal  Screening: Completed 02/24/13. Repeat every 10 years;  Mammogram: Completed 10/02/18. Repeat every year. Ordered 08/22/19. Pt provided with contact information and advised to call to schedule appt.   Bone Density: Completed 08/29/17. Results reflect OSTEOPENIA. Repeat every 2 years. Ordered today. Pt provided with contact information and advised to call to schedule appt.   Lung Cancer Screening: (Low Dose CT Chest recommended if Age 51-80 years, 30 pack-year currently smoking OR have quit w/in 15years.) does not qualify.   Additional Screening:  Hepatitis C Screening: does qualify; Completed 01/28/13  Vision Screening: Recommended annual ophthalmology exams for early detection of glaucoma and other disorders of the eye. Is the patient up to date with their annual eye exam?  No  Who is the provider or what is the name of the office in which the pt attends annual eye exams? Not established If pt is not established with a provider, would they like to be referred to a provider to establish care? No .   Dental Screening: Recommended annual dental exams for proper oral hygiene  Community Resource Referral:  CRR required this visit?  No      Plan:     I have personally reviewed and addressed the Medicare Annual Wellness questionnaire and have noted the following in the patient's chart:  A. Medical and social history B. Use of alcohol, tobacco or illicit drugs  C. Current medications and supplements D. Functional ability and status E.  Nutritional status F.  Physical activity G. Advance directives H. List of other physicians I.  Hospitalizations, surgeries, and ER visits in previous 12 months J.  [place] such as hearing and vision if needed, cognitive and depression L. Referrals and appointments   In addition, I have reviewed and discussed with patient certain preventive protocols, quality metrics, and best practice recommendations. A written personalized care plan for  preventive services as well as general preventive health recommendations were provided to patient.   Signed,  Clemetine Marker, LPN Nurse Health Advisor   Nurse Notes: added patient to [place] waiting list for covid vaccine. Pt completed lab work on 09/17/19 and awaiting results.

## 2019-09-19 NOTE — Patient Instructions (Signed)
Vanessa Hunter , Thank you for taking time to come for your Medicare Wellness Visit. I appreciate your ongoing commitment to your health goals. Please review the following plan we discussed and let me know if I can assist you in the future.   Screening recommendations/referrals: Colonoscopy: done 02/24/13  Mammogram: done 10/02/18. Please call [phone] to schedule your mammogram and bone density screening. Bone Density: done 08/29/17 Recommended yearly ophthalmology/optometry visit for glaucoma screening and checkup Recommended yearly dental visit for hygiene and checkup  Vaccinations: Influenza vaccine: postponed Pneumococcal vaccine: done 07/19/16 Tdap vaccine: due Shingles vaccine: Shingrix discussed. Please contact your pharmacy for coverage information.   Advanced directives: Please bring a copy of your health care power of attorney and living will to the office at your convenience.  Conditions/risks identified: Recommend healthy eating and physical activity for desired weight loss  Next appointment: Please follow up in one year for your Medicare Annual Wellness visit.     Preventive Care 65 Years and Older, Female Preventive care refers to lifestyle choices and visits with your health care provider that can promote health and wellness. What does preventive care include?  A yearly physical exam. This is also called an annual well check.  Dental exams once or twice a year.  Routine eye exams. Ask your health care provider how often you should have your eyes checked.  Personal lifestyle choices, including:  Daily care of your teeth and gums.  Regular physical activity.  Eating a healthy diet.  Avoiding tobacco and drug use.  Limiting alcohol use.  Practicing safe sex.  Taking low-dose aspirin every day.  Taking vitamin and mineral supplements as recommended by your health care provider. What happens during an annual well check? The services and screenings done by  your health care provider during your annual well check will depend on your age, overall health, lifestyle risk factors, and family history of disease. Counseling  Your health care provider may ask you questions about your:  Alcohol use.  Tobacco use.  Drug use.  Emotional well-being.  Home and relationship well-being.  Sexual activity.  Eating habits.  History of falls.  Memory and ability to understand (cognition).  Work and work Statistician.  Reproductive health. Screening  You may have the following tests or measurements:  Height, weight, and BMI.  Blood pressure.  Lipid and cholesterol levels. These may be checked every 5 years, or more frequently if you are over 7 years old.  Skin check.  Lung cancer screening. You may have this screening every year starting at age 60 if you have a 30-pack-year history of smoking and currently smoke or have quit within the past 15 years.  Fecal occult blood test (FOBT) of the stool. You may have this test every year starting at age 14.  Flexible sigmoidoscopy or colonoscopy. You may have a sigmoidoscopy every 5 years or a colonoscopy every 10 years starting at age 44.  Hepatitis C blood test.  Hepatitis B blood test.  Sexually transmitted disease (STD) testing.  Diabetes screening. This is done by checking your blood sugar (glucose) after you have not eaten for a while (fasting). You may have this done every 1-3 years.  Bone density scan. This is done to screen for osteoporosis. You may have this done starting at age 98.  Mammogram. This may be done every 1-2 years. Talk to your health care provider about how often you should have regular mammograms. Talk with your health care provider about your test results, treatment  options, and if necessary, the need for more tests. Vaccines  Your health care provider may recommend certain vaccines, such as:  Influenza vaccine. This is recommended every year.  Tetanus,  diphtheria, and acellular pertussis (Tdap, Td) vaccine. You may need a Td booster every 10 years.  Zoster vaccine. You may need this after age 62.  Pneumococcal 13-valent conjugate (PCV13) vaccine. One dose is recommended after age 2.  Pneumococcal polysaccharide (PPSV23) vaccine. One dose is recommended after age 31. Talk to your health care provider about which screenings and vaccines you need and how often you need them. This information is not intended to replace advice given to you by your health care provider. Make sure you discuss any questions you have with your health care provider. Document Released: 08/27/2015 Document Revised: 04/19/2016 Document Reviewed: 06/01/2015 Elsevier Interactive Patient Education  2017 [place] Prevention in the Home Falls can cause injuries. They can happen to people of all ages. There are many things you can do to make your home safe and to help prevent falls. What can I do on the outside of my home?  Regularly fix the edges of walkways and driveways and fix any cracks.  Remove anything that might make you trip as you walk through a door, such as a raised step or threshold.  Trim any bushes or trees on the path to your home.  Use bright outdoor lighting.  Clear any walking paths of anything that might make someone trip, such as rocks or tools.  Regularly check to see if handrails are loose or broken. Make sure that both sides of any steps have handrails.  Any raised decks and porches should have guardrails on the edges.  Have any leaves, snow, or ice cleared regularly.  Use sand or salt on walking paths during winter.  Clean up any spills in your garage right away. This includes oil or grease spills. What can I do in the bathroom?  Use night lights.  Install grab bars by the toilet and in the tub and shower. Do not use towel bars as grab bars.  Use non-skid mats or decals in the tub or shower.  If you need to sit down in  the shower, use a plastic, non-slip stool.  Keep the floor dry. Clean up any water that spills on the floor as soon as it happens.  Remove soap buildup in the tub or shower regularly.  Attach bath mats securely with double-sided non-slip rug tape.  Do not have throw rugs and other things on the floor that can make you trip. What can I do in the bedroom?  Use night lights.  Make sure that you have a light by your bed that is easy to reach.  Do not use any sheets or blankets that are too big for your bed. They should not hang down onto the floor.  Have a firm chair that has side arms. You can use this for support while you get dressed.  Do not have throw rugs and other things on the floor that can make you trip. What can I do in the kitchen?  Clean up any spills right away.  Avoid walking on wet floors.  Keep items that you use a lot in easy-to-reach places.  If you need to reach something above you, use a strong step stool that has a grab bar.  Keep electrical cords out of the way.  Do not use floor polish or wax that makes floors slippery.  If you must use wax, use non-skid floor wax.  Do not have throw rugs and other things on the floor that can make you trip. What can I do with my stairs?  Do not leave any items on the stairs.  Make sure that there are handrails on both sides of the stairs and use them. Fix handrails that are broken or loose. Make sure that handrails are as long as the stairways.  Check any carpeting to make sure that it is firmly attached to the stairs. Fix any carpet that is loose or worn.  Avoid having throw rugs at the top or bottom of the stairs. If you do have throw rugs, attach them to the floor with carpet tape.  Make sure that you have a light switch at the top of the stairs and the bottom of the stairs. If you do not have them, ask someone to add them for you. What else can I do to help prevent falls?  Wear shoes that:  Do not have high  heels.  Have rubber bottoms.  Are comfortable and fit you well.  Are closed at the toe. Do not wear sandals.  If you use a stepladder:  Make sure that it is fully opened. Do not climb a closed stepladder.  Make sure that both sides of the stepladder are locked into place.  Ask someone to hold it for you, if possible.  Clearly mark and make sure that you can see:  Any grab bars or handrails.  First and last steps.  Where the edge of each step is.  Use tools that help you move around (mobility aids) if they are needed. These include:  Canes.  Walkers.  Scooters.  Crutches.  Turn on the lights when you go into a dark area. Replace any light bulbs as soon as they burn out.  Set up your furniture so you have a clear path. Avoid moving your furniture around.  If any of your floors are uneven, fix them.  If there are any pets around you, be aware of where they are.  Review your medicines with your doctor. Some medicines can make you feel dizzy. This can increase your chance of falling. Ask your doctor what other things that you can do to help prevent falls. This information is not intended to replace advice given to you by your health care provider. Make sure you discuss any questions you have with your health care provider. Document Released: 05/27/2009 Document Revised: 01/06/2016 Document Reviewed: 09/04/2014 Elsevier Interactive Patient Education  2017 [employer].

## 2019-09-25 ENCOUNTER — Ambulatory Visit (INDEPENDENT_AMBULATORY_CARE_PROVIDER_SITE_OTHER): Payer: Medicare Other | Admitting: Family Medicine

## 2019-09-25 ENCOUNTER — Encounter: Payer: Self-pay | Admitting: Family Medicine

## 2019-09-25 ENCOUNTER — Other Ambulatory Visit: Payer: Self-pay

## 2019-09-25 VITALS — BP 132/84 | HR 92 | Temp 96.6°F | Resp 18 | Ht 63.0 in | Wt 204.6 lb

## 2019-09-25 DIAGNOSIS — E785 Hyperlipidemia, unspecified: Secondary | ICD-10-CM

## 2019-09-25 DIAGNOSIS — R7982 Elevated C-reactive protein (CRP): Secondary | ICD-10-CM | POA: Diagnosis not present

## 2019-09-25 MED ORDER — ASPIRIN EC 81 MG PO TBEC: 81.0000 mg | DELAYED_RELEASE_TABLET | Freq: Every day | ORAL | 3 refills | Status: DC

## 2019-09-25 MED ORDER — EZETIMIBE 10 MG PO TABS: 10.0000 mg | ORAL_TABLET | Freq: Every day | ORAL | 3 refills | Status: DC

## 2019-09-25 NOTE — Progress Notes (Signed)
Name: Vanessa Hunter   MRN: [id number]    DOB: [birth year]   Date:09/25/2019       Progress Note  Subjective  Chief Complaint  Chief Complaint  Patient presents with  . discuss labs    States she does not eat fried foods and has been exerising. Wants to discuss cholesterol labs    HPI  Dyslipidemia: her LDL is elevated it was 157 last week, she has been eating oatmeal with water ( she is lactose intolerant) for the last months, she eat fruit or some tree nuts for lunch and eats supper around 4 pm and includes baked chicken or soup, salad. She drinks a lot of water and cranberry juice  Risk of heart attacks and strokes in high and risk of bleeding is low at 2.3 %, therefore I recommend starting aspirin 81 mg and also add zetia 10 mg   Elevated CRP: she denies joint pains or swelling, no redness, no fatigue , fever or chills. Discussed referral to rheumatologist or keep a look for inflammation    Patient Active Problem List   Diagnosis Date Noted  . Senile purpura ([place]) 03/07/2018  . Elevated C-reactive protein 07/28/2017  . Allergic rhinitis 06/10/2015  . Dyslipidemia 06/10/2015  . Osteopenia 06/10/2015  . Pain in shoulder 06/10/2015  . Atrophy of vagina 06/10/2015  . History of diverticulitis 06/10/2015    Past Surgical History:  Procedure Laterality Date  . BREAST BIOPSY Right 03-06-13   RIGHT BREAST AT 10:30, intramammary lymph node  . BREAST BIOPSY Right    neg  . CHOLECYSTECTOMY    . COLONOSCOPY    . TONSILLECTOMY    . TUBAL LIGATION      Family History  Problem Relation Age of Onset  . Hearing loss Mother   . Migraines Mother   . Heart disease Father   . Heart disease Brother   . Cancer Maternal Aunt        breast  . Breast cancer Maternal Aunt   . Colon cancer Other   . Cancer Other        Colon-Niece  . Hyperlipidemia Sister     Social History   Tobacco Use  . Smoking status: Never Smoker  . Smokeless tobacco: Never Used  Substance Use Topics  .  Alcohol use: No    Comment: very seldom   . Drug use: No     Current Outpatient Medications:  .  alendronate (FOSAMAX) 35 MG tablet, Take 1 tablet (35 mg total) by mouth every 7 (seven) days. Take with a full glass of water on an empty stomach., Disp: 12 tablet, Rfl: 3 .  loratadine (CLARITIN) 10 MG tablet, Take 1 tablet (10 mg total) by mouth daily., Disp: 100 tablet, Rfl: 0 .  rosuvastatin (CRESTOR) 40 MG tablet, Take 1 tablet (40 mg total) by mouth daily., Disp: 90 tablet, Rfl: 1 .  valACYclovir (VALTREX) 1000 MG tablet, Take 1 tablet (1,000 mg total) by mouth 2 (two) times daily., Disp: 6 tablet, Rfl: 0 .  Vitamin D, Ergocalciferol, (DRISDOL) 1.25 MG (50000 UT) CAPS capsule, Take 1 capsule (50,000 Units total) by mouth every 7 (seven) days., Disp: 12 capsule, Rfl: 0  Allergies  Allergen Reactions  . Lac Bovis     lactose intolerance  . Orange Juice [Orange Oil] Itching    I personally reviewed active problem list, medication list, allergies, family history, social history, health maintenance with the patient/caregiver today.   ROS  Ten systems reviewed and  is negative except as mentioned in HPI   Objective  Vitals:   09/25/19 1002  Pulse: 92  Resp: 18  Temp: (!) 96.6 F (35.9 C)  TempSrc: Temporal  SpO2: 99%  Weight: 204 lb 9.6 oz (92.8 kg)  Height: 5\' 3"  (1.6 m)    Body mass index is 36.24 kg/m.  Physical Exam  Constitutional: Patient appears well-developed and well-nourished. Obese No distress.  HEENT: head atraumatic, normocephalic, pupils equal and reactive to light Cardiovascular: Normal rate, regular rhythm and normal heart sounds.  No murmur heard. No BLE edema. Pulmonary/Chest: Effort normal and breath sounds normal. No respiratory distress. Abdominal: Soft.  There is no tenderness. Muscular Skeletal: no synovitis  Psychiatric: Patient has a normal mood and affect. behavior is normal. Judgment and thought content normal.   Recent Results (from the  past 2160 hour(s))  Urine Culture     Status: None   Collection Time: 06/30/19 12:00 AM   Specimen: Urine  Result Value Ref Range   MICRO NUMBER: [id number]    SPECIMEN QUALITY: Adequate    Sample Source URINE    STATUS: FINAL    ISOLATE 1:      Growth of mixed flora was isolated, suggesting probable contamination. No further testing will be performed. If clinically indicated, recollection using a method to minimize contamination, with prompt transfer to Urine Culture Transport Tube, is  recommended.   Magnesium     Status: None   Collection Time: 06/30/19 12:00 AM  Result Value Ref Range   Magnesium 1.7 1.5 - 2.5 mg/dL  BASIC METABOLIC PANEL WITH GFR     Status: None   Collection Time: 06/30/19 12:00 AM  Result Value Ref Range   Glucose, Bld 95 65 - 99 mg/dL    Comment: .            Fasting reference interval .    BUN 14 7 - 25 mg/dL   Creat 0.60 0.60 - 0.93 mg/dL    Comment: For patients >76 years of age, the reference limit for Creatinine is approximately 13% higher for people identified as African-American. .    GFR, Est Non African American 91 > OR = 60 mL/min/1.63m2   GFR, Est African American 106 > OR = 60 mL/min/1.41m2   BUN/Creatinine Ratio NOT APPLICABLE 6 - 22 (calc)   Sodium 141 135 - 146 mmol/L   Potassium 4.2 3.5 - 5.3 mmol/L   Chloride 107 98 - 110 mmol/L   CO2 24 20 - 32 mmol/L   Calcium 8.8 8.6 - 10.4 mg/dL  CBC with Differential/Platelet     Status: None   Collection Time: 06/30/19 12:00 AM  Result Value Ref Range   WBC 7.8 3.8 - 10.8 Thousand/uL   RBC 4.15 3.80 - 5.10 Million/uL   Hemoglobin 12.4 11.7 - 15.5 g/dL   HCT 37.3 35.0 - 45.0 %   MCV 89.9 80.0 - 100.0 fL   MCH 29.9 27.0 - 33.0 pg   MCHC 33.2 32.0 - 36.0 g/dL   RDW 12.9 11.0 - 15.0 %   Platelets 361 140 - 400 Thousand/uL   MPV 11.9 7.5 - 12.5 fL   Neutro Abs 4,930 1,500 - 7,800 cells/uL   Lymphs Abs 2,059 850 - 3,900 cells/uL   Absolute Monocytes 585 200 - 950 cells/uL   Eosinophils  Absolute 156 15 - 500 cells/uL   Basophils Absolute 70 0 - 200 cells/uL   Neutrophils Relative % 63.2 %   Total Lymphocyte 26.4 %  Monocytes Relative 7.5 %   Eosinophils Relative 2.0 %   Basophils Relative 0.9 %  C-reactive protein     Status: Abnormal   Collection Time: 09/17/19 10:20 AM  Result Value Ref Range   CRP 12.2 (H) <8.0 mg/L  Lipid panel     Status: Abnormal   Collection Time: 09/17/19 10:20 AM  Result Value Ref Range   Cholesterol 244 (H) <200 mg/dL   HDL 64 > OR = 50 mg/dL   Triglycerides 115 <150 mg/dL   LDL Cholesterol (Calc) 157 (H) mg/dL (calc)    Comment: Reference range: <100 . Desirable range <100 mg/dL for primary prevention;   <70 mg/dL for patients with CHD or diabetic patients  with > or = 2 CHD risk factors. Marland Kitchen LDL-C is now calculated using the Martin-Hopkins  calculation, which is a validated novel method providing  better accuracy than the Friedewald equation in the  estimation of LDL-C.  Cresenciano Genre et al. Annamaria Helling. [id number]): 2061-2068  (http://education.[facility].com/faq/FAQ164)    Total CHOL/HDL Ratio 3.8 <5.0 (calc)   Non-HDL Cholesterol (Calc) 180 (H) <130 mg/dL (calc)    Comment: For patients with diabetes plus 1 major ASCVD risk  factor, treating to a non-HDL-C goal of <100 mg/dL  (LDL-C of <70 mg/dL) is considered a therapeutic  option.   COMPLETE METABOLIC PANEL WITH GFR     Status: Abnormal   Collection Time: 09/17/19 10:20 AM  Result Value Ref Range   Glucose, Bld 102 (H) 65 - 99 mg/dL    Comment: .            Fasting reference interval . For someone without known diabetes, a glucose value between 100 and 125 mg/dL is consistent with prediabetes and should be confirmed with a follow-up test. .    BUN 11 7 - 25 mg/dL   Creat 0.72 0.60 - 0.93 mg/dL    Comment: For patients >41 years of age, the reference limit for Creatinine is approximately 13% higher for people identified as African-American. .    GFR, Est Non  African American 83 > OR = 60 mL/min/1.22m2   GFR, Est African American 96 > OR = 60 mL/min/1.66m2   BUN/Creatinine Ratio NOT APPLICABLE 6 - 22 (calc)   Sodium 142 135 - 146 mmol/L   Potassium 3.4 (L) 3.5 - 5.3 mmol/L   Chloride 105 98 - 110 mmol/L   CO2 27 20 - 32 mmol/L   Calcium 9.0 8.6 - 10.4 mg/dL   Total Protein 7.7 6.1 - 8.1 g/dL   Albumin 4.0 3.6 - 5.1 g/dL   Globulin 3.7 1.9 - 3.7 g/dL (calc)   AG Ratio 1.1 1.0 - 2.5 (calc)   Total Bilirubin 0.4 0.2 - 1.2 mg/dL   Alkaline phosphatase (APISO) 72 37 - 153 U/L   AST 16 10 - 35 U/L   ALT 10 6 - 29 U/L  VITAMIN D 25 Hydroxy (Vit-D Deficiency, Fractures)     Status: None   Collection Time: 09/17/19 10:20 AM  Result Value Ref Range   Vit D, 25-Hydroxy 33 30 - 100 ng/mL    Comment: Vitamin D Status         25-OH Vitamin D: . Deficiency:                    <20 ng/mL Insufficiency:             20 - 29 ng/mL Optimal:                 >  or = 30 ng/mL . For 25-OH Vitamin D testing on patients on  D2-supplementation and patients for whom quantitation  of D2 and D3 fractions is required, the [facility]ureD(TM) 25-OH VIT D, (D2,D3), LC/MS/MS is recommended: order  code [id number] (patients >45yrs). See Note 1 . Note 1 . For additional information, please refer to  http://education.[facility].com/faq/FAQ199  (This link is being provided for informational/ educational purposes only.)   High sensitivity CRP     Status: Abnormal   Collection Time: 09/17/19 10:20 AM  Result Value Ref Range   hs-CRP >10.0 (H) mg/L    Comment: Reference Range Optimal <1.0 Jellinger PS et al. [facility] [place] [facility].2017;23(Suppl 2):1-87. . For ages >70 Years: hs-CRP mg/L  Risk According to AHA/CDC Guidelines <1.0         Lower relative cardiovascular risk. 1.0-3.0      Average relative cardiovascular risk. 3.1-10.0     Higher relative cardiovascular risk.              Consider retesting in 1 to 2 weeks to              exclude a benign transient elevation               in the baseline CRP value secondary              to infection or inflammation. >10.0        Persistent elevation, upon retesting,              may be associated with infection and              inflammation. .       PHQ2/9: Depression screen [facility] 2/9 09/25/2019 09/19/2019 08/22/2019 06/10/2019 05/29/2019  Decreased Interest 0 0 0 0 0  Down, Depressed, Hopeless 0 0 0 0 0  PHQ - 2 Score 0 0 0 0 0  Altered sleeping 0 - 0 0 0  Tired, decreased energy 0 - 0 0 0  Change in appetite 0 - 0 0 0  Feeling bad or failure about yourself  0 - 0 0 0  Trouble concentrating 0 - 0 0 0  Moving slowly or fidgety/restless 0 - 0 0 0  Suicidal thoughts 0 - 0 0 0  PHQ-9 Score 0 - 0 0 0  Difficult doing work/chores Not difficult at all - Not difficult at all Not difficult at all Not difficult at all  Some recent data might be hidden    phq 9 is negative   Fall Risk: Fall Risk  09/25/2019 09/19/2019 08/22/2019 06/10/2019 05/29/2019  Falls in the past year? 0 0 0 0 0  Number falls in past yr: 0 0 0 0 0  Injury with Fall? 0 0 0 0 0  Risk for fall due to : - No Fall Risks - - -  Follow up - Falls prevention discussed - - -     Functional Status Survey: Is the patient deaf or have difficulty hearing?: No Does the patient have difficulty seeing, even when wearing glasses/contacts?: No Does the patient have difficulty concentrating, remembering, or making decisions?: No Does the patient have difficulty walking or climbing stairs?: No Does the patient have difficulty dressing or bathing?: No Does the patient have difficulty doing errands alone such as visiting a doctor's office or shopping?: No   Assessment & Plan  1. Dyslipidemia  - aspirin EC 81 MG tablet; Take 1 tablet (81 mg total) by mouth daily.  Dispense: 100 tablet;  Refill: 3 - ezetimibe (ZETIA) 10 MG tablet; Take 1 tablet (10 mg total) by mouth daily.  Dispense: 90 tablet; Refill: 3  2. Elevated C-reactive protein  Reviewed labs with  her, advised to have repeat mammogram, but normal in the past , colonoscopy up to date

## 2019-10-20 ENCOUNTER — Inpatient Hospital Stay: Admission: RE | Admit: 2019-10-20 | Payer: Medicare Other | Source: Ambulatory Visit

## 2019-10-20 ENCOUNTER — Other Ambulatory Visit: Payer: Self-pay

## 2019-10-20 ENCOUNTER — Ambulatory Visit
Admission: RE | Admit: 2019-10-20 | Discharge: 2019-10-20 | Disposition: A | Discharge status: Home / Self Care | Payer: Medicare Other | Source: Ambulatory Visit | Attending: Family Medicine | Admitting: Family Medicine

## 2019-10-20 DIAGNOSIS — Z1231 Encounter for screening mammogram for malignant neoplasm of breast: Secondary | ICD-10-CM | POA: Insufficient documentation

## 2019-10-22 ENCOUNTER — Ambulatory Visit
Admission: RE | Admit: 2019-10-22 | Discharge: 2019-10-22 | Disposition: A | Discharge status: Home / Self Care | Payer: Medicare Other | Source: Ambulatory Visit | Attending: Family Medicine | Admitting: Family Medicine

## 2019-10-22 ENCOUNTER — Other Ambulatory Visit: Payer: Self-pay

## 2019-10-22 DIAGNOSIS — M85851 Other specified disorders of bone density and structure, right thigh: Secondary | ICD-10-CM | POA: Diagnosis not present

## 2019-10-22 DIAGNOSIS — M8580 Other specified disorders of bone density and structure, unspecified site: Secondary | ICD-10-CM | POA: Insufficient documentation

## 2019-10-22 DIAGNOSIS — Z780 Asymptomatic menopausal state: Secondary | ICD-10-CM | POA: Insufficient documentation

## 2019-11-01 ENCOUNTER — Ambulatory Visit: Payer: Medicare Other | Attending: Internal Medicine

## 2019-11-01 DIAGNOSIS — Z23 Encounter for immunization: Secondary | ICD-10-CM

## 2019-11-01 NOTE — Progress Notes (Signed)
   Covid-19 Vaccination Clinic  Name:  Vanessa Hunter    MRN: [id number] DOB: June 17, 1946  11/01/2019  [name] was observed post Covid-19 immunization for 15 minutes without incident. She was provided with Vaccine Information Sheet and instruction to access the V-Safe system.   [name] was instructed to call 911 with any severe reactions post vaccine: Marland Kitchen Difficulty breathing  . Swelling of face and throat  . A fast heartbeat  . A bad rash all over body  . Dizziness and weakness   Immunizations Administered    Name Date Dose VIS Date Route   Pfizer COVID-19 Vaccine 11/01/2019  9:31 AM 0.3 mL 07/25/2019 Intramuscular   Manufacturer: [place]   Lot: [id number]   [place]: [id number]

## 2019-11-15 ENCOUNTER — Other Ambulatory Visit: Payer: Self-pay | Admitting: Family Medicine

## 2019-11-15 DIAGNOSIS — E559 Vitamin D deficiency, unspecified: Secondary | ICD-10-CM

## 2019-11-15 NOTE — Telephone Encounter (Signed)
Requested medication (s) are due for refill today: yes  Requested medication (s) are on the active medication list: yes  Last refill:  08/22/19  Future visit scheduled: yes  Notes to clinic:  medication not delegated to NT to refill   Requested Prescriptions  Pending Prescriptions Disp Refills   Vitamin D, Ergocalciferol, (DRISDOL) 1.25 MG (50000 UNIT) CAPS capsule [Pharmacy Med Name: VITAMIN D2 50,000IU (ERGO) CAP RX] 12 capsule 0    Sig: TAKE 1 CAPSULE BY MOUTH EVERY 7 DAYS      Endocrinology:  Vitamins - Vitamin D Supplementation Failed - 11/15/2019 10:24 AM      Failed - 50,000 IU strengths are not delegated      Failed - Phosphate in normal range and within 360 days    No results found for: PHOS        Passed - Ca in normal range and within 360 days    Calcium  Date Value Ref Range Status  09/17/2019 9.0 8.6 - 10.4 mg/dL Final          Passed - Vitamin D in normal range and within 360 days    Vit D, 25-Hydroxy  Date Value Ref Range Status  09/17/2019 33 30 - 100 ng/mL Final    Comment:    Vitamin D Status         25-OH Vitamin D: . Deficiency:                    <20 ng/mL Insufficiency:             20 - 29 ng/mL Optimal:                 > or = 30 ng/mL . For 25-OH Vitamin D testing on patients on  D2-supplementation and patients for whom quantitation  of D2 and D3 fractions is required, the QuestAssureD(TM) 25-OH VIT D, (D2,D3), LC/MS/MS is recommended: order  code [id number] (patients >15yrs). See Note 1 . Note 1 . For additional information, please refer to  http://education.[facility].com/faq/FAQ199  (This link is being provided for informational/ educational purposes only.)           Passed - Valid encounter within last 12 months    Recent Outpatient Visits           1 month ago Dyslipidemia   [facility] Steele Sizer, MD   2 months ago Dyslipidemia   [facility] Steele Sizer, MD   5 months ago  Leukocytosis, unspecified type   [facility] Steele Sizer, MD   5 months ago Diarrhea, unspecified type   [place], FNP   8 months ago Educated About [place], [place]             In 3 months Steele Sizer, MD [facility], [facility]

## 2019-11-26 ENCOUNTER — Ambulatory Visit: Payer: Medicare Other | Attending: Internal Medicine

## 2019-11-26 DIAGNOSIS — Z23 Encounter for immunization: Secondary | ICD-10-CM

## 2019-11-26 NOTE — Progress Notes (Signed)
   Covid-19 Vaccination Clinic  Name:  Vanessa Hunter    MRN: [id number] DOB: June 01, 1946  11/26/2019  [name] was observed post Covid-19 immunization for 15 minutes without incident. She was provided with Vaccine Information Sheet and instruction to access the V-Safe system.   [name] was instructed to call 911 with any severe reactions post vaccine: Marland Kitchen Difficulty breathing  . Swelling of face and throat  . A fast heartbeat  . A bad rash all over body  . Dizziness and weakness   Immunizations Administered    Name Date Dose VIS Date Route   Pfizer COVID-19 Vaccine 11/26/2019  9:51 AM 0.3 mL 07/25/2019 Intramuscular   Manufacturer: [place]   Lot: [id number]   [place]ellow[place]: [id number]

## 2020-02-13 ENCOUNTER — Encounter: Payer: Self-pay | Admitting: Family Medicine

## 2020-02-13 ENCOUNTER — Other Ambulatory Visit: Payer: Self-pay

## 2020-02-13 ENCOUNTER — Ambulatory Visit (INDEPENDENT_AMBULATORY_CARE_PROVIDER_SITE_OTHER): Payer: Medicare Other | Admitting: Family Medicine

## 2020-02-13 VITALS — BP 124/78 | HR 72 | Temp 97.9°F | Resp 16 | Ht 63.0 in | Wt 206.7 lb

## 2020-02-13 DIAGNOSIS — R7982 Elevated C-reactive protein (CRP): Secondary | ICD-10-CM

## 2020-02-13 DIAGNOSIS — Z79899 Other long term (current) drug therapy: Secondary | ICD-10-CM

## 2020-02-13 DIAGNOSIS — E669 Obesity, unspecified: Secondary | ICD-10-CM

## 2020-02-13 DIAGNOSIS — M8580 Other specified disorders of bone density and structure, unspecified site: Secondary | ICD-10-CM

## 2020-02-13 DIAGNOSIS — E559 Vitamin D deficiency, unspecified: Secondary | ICD-10-CM

## 2020-02-13 DIAGNOSIS — E785 Hyperlipidemia, unspecified: Secondary | ICD-10-CM

## 2020-02-13 DIAGNOSIS — D692 Other nonthrombocytopenic purpura: Secondary | ICD-10-CM

## 2020-02-13 DIAGNOSIS — R739 Hyperglycemia, unspecified: Secondary | ICD-10-CM

## 2020-02-13 NOTE — Progress Notes (Signed)
Name: Vanessa Hunter   MRN: [id number]    DOB: [birth year]   Date:02/13/2020       Progress Note  Subjective  Chief Complaint  Chief Complaint  Patient presents with  . Follow-up    Dyslipidemia    HPI   Dyslipidemia: her LDL is elevated it was 157 she is now on Ezetemide and tolerating it well, no chest pain, palpitation, sob or decrease in exercise tolerance, we will recheck labs fasting   Elevated CRP: she denies joint pains or swelling, no redness, no fatigue , fever or chills. Discussed referral to rheumatologist but she would like to just monitor here   Hyperglycemia: she denies polyphagia, polyuria or polydipsia   Senile purpura: intermittent on arms. Reassurance given   Obesity: she is eating salads and fruit, she is active in her yard, also walks, stretching , explained that her BMI has been stable and since she  Is 74 yo no need to try to lose weight but to maintain her weight and continues healthy lifestyle   AR: she states she has seasonal allergies , only triggered in the Fall and Spring, doing well at this time. Currently no rhinorrhea, nasal congestion or sneezing   Osteopenia: she has low vitamin D , discussed adding calcium to her diet, and continue supplementation   Patient Active Problem List   Diagnosis Date Noted  . Senile purpura ([place]) 03/07/2018  . Elevated C-reactive protein 07/28/2017  . Allergic rhinitis 06/10/2015  . Dyslipidemia 06/10/2015  . Osteopenia 06/10/2015  . Pain in shoulder 06/10/2015  . Atrophy of vagina 06/10/2015  . History of diverticulitis 06/10/2015    Past Surgical History:  Procedure Laterality Date  . BREAST BIOPSY Right 03-06-13   RIGHT BREAST AT 10:30, intramammary lymph node  . BREAST BIOPSY Right    neg  . CHOLECYSTECTOMY    . COLONOSCOPY    . TONSILLECTOMY    . TUBAL LIGATION      Family History  Problem Relation Age of Onset  . Hearing loss Mother   . Migraines Mother   . Heart disease Father   . Heart disease  Brother   . Cancer Maternal Aunt        breast  . Breast cancer Maternal Aunt   . Colon cancer Other   . Cancer Other        Colon-Niece  . Hyperlipidemia Sister     Social History   Tobacco Use  . Smoking status: Never Smoker  . Smokeless tobacco: Never Used  Substance Use Topics  . Alcohol use: No    Comment: very seldom      Current Outpatient Medications:  .  alendronate (FOSAMAX) 35 MG tablet, Take 1 tablet (35 mg total) by mouth every 7 (seven) days. Take with a full glass of water on an empty stomach., Disp: 12 tablet, Rfl: 3 .  aspirin EC 81 MG tablet, Take 1 tablet (81 mg total) by mouth daily., Disp: 100 tablet, Rfl: 3 .  ezetimibe (ZETIA) 10 MG tablet, Take 1 tablet (10 mg total) by mouth daily., Disp: 90 tablet, Rfl: 3 .  loratadine (CLARITIN) 10 MG tablet, Take 1 tablet (10 mg total) by mouth daily., Disp: 100 tablet, Rfl: 0 .  rosuvastatin (CRESTOR) 40 MG tablet, Take 1 tablet (40 mg total) by mouth daily., Disp: 90 tablet, Rfl: 1 .  valACYclovir (VALTREX) 1000 MG tablet, Take 1 tablet (1,000 mg total) by mouth 2 (two) times daily., Disp: 6 tablet, Rfl: 0 .  Vitamin D, Ergocalciferol, (DRISDOL) 1.25 MG (50000 UNIT) CAPS capsule, TAKE 1 CAPSULE BY MOUTH EVERY 7 DAYS, Disp: 12 capsule, Rfl: 0  Allergies  Allergen Reactions  . Lac Bovis     lactose intolerance  . Orange Juice [Orange Oil] Itching    I personally reviewed active problem list, medication list, allergies, family history, social history, health maintenance with the patient/caregiver today.   ROS  Constitutional: Negative for fever or weight change.  Respiratory: Negative for cough and shortness of breath.   Cardiovascular: Negative for chest pain or palpitations.  Gastrointestinal: Negative for abdominal pain, no bowel changes.  Musculoskeletal: Negative for gait problem or joint swelling.  Skin: Negative for rash.  Neurological: Negative for dizziness or headache.  No other specific complaints  in a complete review of systems (except as listed in HPI above).  Objective  Vitals:   02/13/20 0945  BP: 124/78  Pulse: 72  Resp: 16  Temp: 97.9 F (36.6 C)  TempSrc: Temporal  SpO2: 97%  Weight: 206 lb 11.2 oz (93.8 kg)  Height: 5\' 3"  (1.6 m)    Body mass index is 36.62 kg/m.  Physical Exam  Constitutional: Patient appears well-developed and well-nourished. Obese No distress.  HEENT: head atraumatic, normocephalic, pupils equal and reactive to light,  neck supple Cardiovascular: Normal rate, regular rhythm and normal heart sounds.  No murmur heard. No BLE edema. Pulmonary/Chest: Effort normal and breath sounds normal. No respiratory distress. Abdominal: Soft.  There is no tenderness. Psychiatric: Patient has a normal mood and affect. behavior is normal. Judgment and thought content normal.   PHQ2/9: Depression screen [facility] 2/9 02/13/2020 09/25/2019 09/19/2019 08/22/2019 06/10/2019  Decreased Interest 0 0 0 0 0  Down, Depressed, Hopeless 0 0 0 0 0  PHQ - 2 Score 0 0 0 0 0  Altered sleeping 0 0 - 0 0  Tired, decreased energy 0 0 - 0 0  Change in appetite 0 0 - 0 0  Feeling bad or failure about yourself  0 0 - 0 0  Trouble concentrating 0 0 - 0 0  Moving slowly or fidgety/restless 0 0 - 0 0  Suicidal thoughts 0 0 - 0 0  PHQ-9 Score 0 0 - 0 0  Difficult doing work/chores - Not difficult at all - Not difficult at all Not difficult at all  Some recent data might be hidden    phq 9 is negative   Fall Risk: Fall Risk  02/13/2020 09/25/2019 09/19/2019 08/22/2019 06/10/2019  Falls in the past year? 0 0 0 0 0  Number falls in past yr: 0 0 0 0 0  Injury with Fall? 0 0 0 0 0  Risk for fall due to : - - No Fall Risks - -  Follow up - - Falls prevention discussed - -     Functional Status Survey: Is the patient deaf or have difficulty hearing?: No Does the patient have difficulty seeing, even when wearing glasses/contacts?: No Does the patient have difficulty concentrating,  remembering, or making decisions?: No Does the patient have difficulty walking or climbing stairs?: No Does the patient have difficulty dressing or bathing?: No Does the patient have difficulty doing errands alone such as visiting a doctor's office or shopping?: No    Assessment & Plan  1. Dyslipidemia  - Lipid panel  2. Senile purpura ([place])  Doing well at this time  3. Vitamin D deficiency  - VITAMIN D 25 Hydroxy (Vit-D Deficiency, Fractures)  4. Elevated C-reactive  protein  - CBC with Differential/Platelet - C-reactive protein  5. Obesity (BMI 35.0-39.9 without comorbidity)  Discussed with the patient the risk posed by an increased BMI. Discussed importance of portion control, calorie counting and at least 150 minutes of physical activity weekly. Avoid sweet beverages and drink more water. Eat at least 6 servings of fruit and vegetables daily   6. Osteopenia, unspecified location  Discussed high calcium diet  7. Long-term use of high-risk medication  - COMPLETE METABOLIC PANEL WITH GFR  8. Hyperglycemia  - Hemoglobin A1c

## 2020-02-20 ENCOUNTER — Ambulatory Visit: Payer: Medicare Other | Admitting: Family Medicine

## 2020-08-09 NOTE — Progress Notes (Deleted)
Name: Vanessa Hunter   MRN: [id number]    DOB: [birth year]   Date:08/09/2020       Progress Note  Subjective  Chief Complaint  Follow up  HPI  Dyslipidemia: her LDL is elevated it was 157 she is now on Ezetemide and tolerating it well, no chest pain, palpitation, sob or decrease in exercise tolerance, we will recheck labs fasting   Elevated CRP: she denies joint pains or swelling, no redness, no fatigue , fever or chills. Discussed referral to rheumatologist but she would like to just monitor here   Hyperglycemia: she denies polyphagia, polyuria or polydipsia   Senile purpura: intermittent on arms. Reassurance given   Obesity: she is eating salads and fruit, she is active in her yard, also walks, stretching , explained that her BMI has been stable and since she  Is 74 yo no need to try to lose weight but to maintain her weight and continues healthy lifestyle   AR: she states she has seasonal allergies , only triggered in the Fall and Spring, doing well at this time. Currently no rhinorrhea, nasal congestion or sneezing   Osteopenia: she has low vitamin D , discussed adding calcium to her diet, and continue supplementation  Patient Active Problem List   Diagnosis Date Noted  . Senile purpura (HCC) 03/07/2018  . Elevated C-reactive protein 07/28/2017  . Allergic rhinitis 06/10/2015  . Dyslipidemia 06/10/2015  . Osteopenia 06/10/2015  . Pain in shoulder 06/10/2015  . Atrophy of vagina 06/10/2015  . History of diverticulitis 06/10/2015    Past Surgical History:  Procedure Laterality Date  . BREAST BIOPSY Right 03-06-13   RIGHT BREAST AT 10:30, intramammary lymph node  . BREAST BIOPSY Right    neg  . CHOLECYSTECTOMY    . COLONOSCOPY    . TONSILLECTOMY    . TUBAL LIGATION      Family History  Problem Relation Age of Onset  . Hearing loss Mother   . Migraines Mother   . Heart disease Father   . Heart disease Brother   . Cancer Maternal Aunt        breast  . Breast  cancer Maternal Aunt   . Colon cancer Other   . Cancer Other        Colon-Niece  . Hyperlipidemia Sister     Social History   Tobacco Use  . Smoking status: Never Smoker  . Smokeless tobacco: Never Used  Substance Use Topics  . Alcohol use: No    Comment: very seldom      Current Outpatient Medications:  .  alendronate (FOSAMAX) 35 MG tablet, Take 1 tablet (35 mg total) by mouth every 7 (seven) days. Take with a full glass of water on an empty stomach., Disp: 12 tablet, Rfl: 3 .  aspirin EC 81 MG tablet, Take 1 tablet (81 mg total) by mouth daily., Disp: 100 tablet, Rfl: 3 .  ezetimibe (ZETIA) 10 MG tablet, Take 1 tablet (10 mg total) by mouth daily., Disp: 90 tablet, Rfl: 3 .  loratadine (CLARITIN) 10 MG tablet, Take 1 tablet (10 mg total) by mouth daily., Disp: 100 tablet, Rfl: 0 .  rosuvastatin (CRESTOR) 40 MG tablet, Take 1 tablet (40 mg total) by mouth daily., Disp: 90 tablet, Rfl: 1 .  valACYclovir (VALTREX) 1000 MG tablet, Take 1 tablet (1,000 mg total) by mouth 2 (two) times daily., Disp: 6 tablet, Rfl: 0 .  Vitamin D, Ergocalciferol, (DRISDOL) 1.25 MG (50000 UNIT) CAPS capsule, TAKE 1 CAPSULE  BY MOUTH EVERY 7 DAYS, Disp: 12 capsule, Rfl: 0  Allergies  Allergen Reactions  . Lac Bovis     lactose intolerance  . Orange Juice [Orange Oil] Itching    I personally reviewed {Reviewed:[id number]} with the patient/caregiver today.   ROS  ***  Objective  There were no vitals filed for this visit.  There is no height or weight on file to calculate BMI.  Physical Exam ***  No results found for this or any previous visit (from the past 2160 hour(s)).  Diabetic Foot Exam: Diabetic Foot Exam - Simple   No data filed    ***  PHQ2/9: Depression screen [facility] 2/9 02/13/2020 09/25/2019 09/19/2019 08/22/2019 06/10/2019  Decreased Interest 0 0 0 0 0  Down, Depressed, Hopeless 0 0 0 0 0  PHQ - 2 Score 0 0 0 0 0  Altered sleeping 0 0 - 0 0  Tired, decreased energy 0 0 - 0 0   Change in appetite 0 0 - 0 0  Feeling bad or failure about yourself  0 0 - 0 0  Trouble concentrating 0 0 - 0 0  Moving slowly or fidgety/restless 0 0 - 0 0  Suicidal thoughts 0 0 - 0 0  PHQ-9 Score 0 0 - 0 0  Difficult doing work/chores - Not difficult at all - Not difficult at all Not difficult at all  Some recent data might be hidden    phq 9 is {gen pos [id number] ***  Fall Risk: Fall Risk  02/13/2020 09/25/2019 09/19/2019 08/22/2019 06/10/2019  Falls in the past year? 0 0 0 0 0  Number falls in past yr: 0 0 0 0 0  Injury with Fall? 0 0 0 0 0  Risk for fall due to : - - No Fall Risks - -  Follow up - - Falls prevention discussed - -   ***   Functional Status Survey:   ***   Assessment & Plan  *** There are no diagnoses linked to this encounter.

## 2020-08-16 ENCOUNTER — Ambulatory Visit: Admitting: Family Medicine

## 2020-09-07 NOTE — Progress Notes (Unsigned)
Name: Vanessa Hunter   MRN: [id number]    DOB: 1946/02/18   Date:09/08/2020       Progress Note  Subjective  Chief Complaint  Follow up   HPI   Dyslipidemia: she will have labs done today, she is taking Rosuvastatin and Ezetimide, no side effects   Elevated CRP: she denies joint pains or swelling, no redness, no fatigue , fever or chills. Discussed referral to rheumatologist but she would like to just monitor here . We will recheck levels today   Hyperglycemia: she denies polyphagia, polyuria or polydipsia She will have A1C done today   Senile purpura: intermittent on arms. She states no recent spots. Doing well   Obesity: she states not having a very healthy over the past weeks due to spending time with son and his family in [place]. BMI above 35. She usually eats a lot of salads   AR: she states she has seasonal allergies , only triggered in the Fall and Spring,takes medications prn   Osteopenia: she has low vitamin D , discussed adding calcium to her diet, and continue supplementation   Patient Active Problem List   Diagnosis Date Noted  . Senile purpura ([place]) 03/07/2018  . Elevated C-reactive protein 07/28/2017  . Allergic rhinitis 06/10/2015  . Dyslipidemia 06/10/2015  . Osteopenia 06/10/2015  . Pain in shoulder 06/10/2015  . Atrophy of vagina 06/10/2015  . History of diverticulitis 06/10/2015    Past Surgical History:  Procedure Laterality Date  . BREAST BIOPSY Right 03-06-13   RIGHT BREAST AT 10:30, intramammary lymph node  . BREAST BIOPSY Right    neg  . CHOLECYSTECTOMY    . COLONOSCOPY    . TONSILLECTOMY    . TUBAL LIGATION      Family History  Problem Relation Age of Onset  . Hearing loss Mother   . Migraines Mother   . Heart disease Father   . Heart disease Brother   . Cancer Maternal Aunt        breast  . Breast cancer Maternal Aunt   . Colon cancer Other   . Cancer Other        Colon-Niece  . Hyperlipidemia Sister     Social History    Tobacco Use  . Smoking status: Never Smoker  . Smokeless tobacco: Never Used  Substance Use Topics  . Alcohol use: No    Comment: very seldom      Current Outpatient Medications:  .  alendronate (FOSAMAX) 35 MG tablet, Take 1 tablet (35 mg total) by mouth every 7 (seven) days. Take with a full glass of water on an empty stomach., Disp: 12 tablet, Rfl: 3 .  aspirin EC 81 MG tablet, Take 1 tablet (81 mg total) by mouth daily., Disp: 100 tablet, Rfl: 3 .  ezetimibe (ZETIA) 10 MG tablet, Take 1 tablet (10 mg total) by mouth daily., Disp: 90 tablet, Rfl: 3 .  loratadine (CLARITIN) 10 MG tablet, Take 1 tablet (10 mg total) by mouth daily., Disp: 100 tablet, Rfl: 0 .  rosuvastatin (CRESTOR) 40 MG tablet, Take 1 tablet (40 mg total) by mouth daily., Disp: 90 tablet, Rfl: 1 .  valACYclovir (VALTREX) 1000 MG tablet, Take 1 tablet (1,000 mg total) by mouth 2 (two) times daily., Disp: 6 tablet, Rfl: 0 .  Vitamin D, Ergocalciferol, (DRISDOL) 1.25 MG (50000 UNIT) CAPS capsule, TAKE 1 CAPSULE BY MOUTH EVERY 7 DAYS, Disp: 12 capsule, Rfl: 0  Allergies  Allergen Reactions  . Lac  Bovis     lactose intolerance  . Orange Juice [Orange Oil] Itching    I personally reviewed active problem list, medication list, allergies, family history, social history, health maintenance with the patient/caregiver today.   ROS  Constitutional: Negative for fever , positive for mild weight change.  Respiratory: Negative for cough and shortness of breath.   Cardiovascular: Negative for chest pain or palpitations.  Gastrointestinal: Negative for abdominal pain, no bowel changes.  Musculoskeletal: Negative for gait problem or joint swelling.  Skin: Negative for rash.  Neurological: Negative for dizziness or headache.  No other specific complaints in a complete review of systems (except as listed in HPI above).  Objective  Vitals:   09/08/20 0922  BP: 138/82  Pulse: 84  Resp: 16  Temp: 97.9 F (36.6 C)   TempSrc: Oral  SpO2: 98%  Weight: 211 lb 3.2 oz (95.8 kg)  Height: 5\' 3"  (1.6 m)    Body mass index is 37.41 kg/m.  Physical Exam  Constitutional: Patient appears well-developed and well-nourished. Obese  No distress.  HEENT: head atraumatic, normocephalic, pupils equal and reactive to light,  neck supple Cardiovascular: Normal rate, regular rhythm and normal heart sounds.  No murmur heard. No BLE edema. Pulmonary/Chest: Effort normal and breath sounds normal. No respiratory distress. Abdominal: Soft.  There is no tenderness. Muscular skeletal: no synovitis  Psychiatric: Patient has a normal mood and affect. behavior is normal. Judgment and thought content normal.  PHQ2/9: Depression screen [facility] 2/9 09/08/2020 02/13/2020 09/25/2019 09/19/2019 08/22/2019  Decreased Interest 0 0 0 0 0  Down, Depressed, Hopeless 0 0 0 0 0  PHQ - 2 Score 0 0 0 0 0  Altered sleeping - 0 0 - 0  Tired, decreased energy - 0 0 - 0  Change in appetite - 0 0 - 0  Feeling bad or failure about yourself  - 0 0 - 0  Trouble concentrating - 0 0 - 0  Moving slowly or fidgety/restless - 0 0 - 0  Suicidal thoughts - 0 0 - 0  PHQ-9 Score - 0 0 - 0  Difficult doing work/chores - - Not difficult at all - Not difficult at all  Some recent data might be hidden    phq 9 is negative   Fall Risk: Fall Risk  09/08/2020 02/13/2020 09/25/2019 09/19/2019 08/22/2019  Falls in the past year? 0 0 0 0 0  Number falls in past yr: 0 0 0 0 0  Injury with Fall? 0 0 0 0 0  Risk for fall due to : - - - No Fall Risks -  Follow up - - - Falls prevention discussed -     Functional Status Survey: Is the patient deaf or have difficulty hearing?: No Does the patient have difficulty seeing, even when wearing glasses/contacts?: No Does the patient have difficulty concentrating, remembering, or making decisions?: No Does the patient have difficulty walking or climbing stairs?: No Does the patient have difficulty dressing or bathing?: No Does  the patient have difficulty doing errands alone such as visiting a doctor's office or shopping?: No   Assessment & Plan  1. Senile purpura (HCC)  Stable   2. Dyslipidemia  - ezetimibe (ZETIA) 10 MG tablet; Take 1 tablet (10 mg total) by mouth daily.  Dispense: 90 tablet; Refill: 3 - rosuvastatin (CRESTOR) 40 MG tablet; Take 1 tablet (40 mg total) by mouth daily.  Dispense: 90 tablet; Refill: 3  3. Elevated C-reactive protein   4. Vitamin  D deficiency  - Vitamin D, Ergocalciferol, (DRISDOL) 1.25 MG (50000 UNIT) CAPS capsule; Take 1 capsule (50,000 Units total) by mouth every 7 (seven) days.  Dispense: 12 capsule; Refill: 3  5. Obesity (BMI 35.0-39.9 without comorbidity)  Discussed with the patient the risk posed by an increased BMI. Discussed importance of portion control, calorie counting and at least 150 minutes of physical activity weekly. Avoid sweet beverages and drink more water. Eat at least 6 servings of fruit and vegetables daily   6. Osteopenia of neck of right femur  - alendronate (FOSAMAX) 35 MG tablet; Take 1 tablet (35 mg total) by mouth every 7 (seven) days. Take with a full glass of water on an empty stomach.  Dispense: 12 tablet; Refill: 3

## 2020-09-08 ENCOUNTER — Other Ambulatory Visit: Payer: Self-pay

## 2020-09-08 ENCOUNTER — Ambulatory Visit (INDEPENDENT_AMBULATORY_CARE_PROVIDER_SITE_OTHER): Payer: Medicare Other | Admitting: Family Medicine

## 2020-09-08 ENCOUNTER — Encounter: Payer: Self-pay | Admitting: Family Medicine

## 2020-09-08 VITALS — BP 138/82 | HR 84 | Temp 97.9°F | Resp 16 | Ht 63.0 in | Wt 211.2 lb

## 2020-09-08 DIAGNOSIS — E785 Hyperlipidemia, unspecified: Secondary | ICD-10-CM

## 2020-09-08 DIAGNOSIS — R739 Hyperglycemia, unspecified: Secondary | ICD-10-CM | POA: Diagnosis not present

## 2020-09-08 DIAGNOSIS — M85851 Other specified disorders of bone density and structure, right thigh: Secondary | ICD-10-CM

## 2020-09-08 DIAGNOSIS — E559 Vitamin D deficiency, unspecified: Secondary | ICD-10-CM | POA: Diagnosis not present

## 2020-09-08 DIAGNOSIS — R7982 Elevated C-reactive protein (CRP): Secondary | ICD-10-CM | POA: Diagnosis not present

## 2020-09-08 DIAGNOSIS — E669 Obesity, unspecified: Secondary | ICD-10-CM | POA: Diagnosis not present

## 2020-09-08 DIAGNOSIS — Z79899 Other long term (current) drug therapy: Secondary | ICD-10-CM | POA: Diagnosis not present

## 2020-09-08 DIAGNOSIS — D692 Other nonthrombocytopenic purpura: Secondary | ICD-10-CM | POA: Diagnosis not present

## 2020-09-08 MED ORDER — ALENDRONATE SODIUM 35 MG PO TABS: 35.0000 mg | ORAL_TABLET | ORAL | 3 refills | Status: AC

## 2020-09-08 MED ORDER — ROSUVASTATIN CALCIUM 40 MG PO TABS: 40.0000 mg | ORAL_TABLET | Freq: Every day | ORAL | 3 refills | Status: AC

## 2020-09-08 MED ORDER — EZETIMIBE 10 MG PO TABS: 10.0000 mg | ORAL_TABLET | Freq: Every day | ORAL | 3 refills | Status: AC

## 2020-09-08 MED ORDER — VITAMIN D (ERGOCALCIFEROL) 1.25 MG (50000 UNIT) PO CAPS: 50000.0000 [IU] | ORAL_CAPSULE | ORAL | 3 refills | Status: AC

## 2020-09-09 LAB — COMPLETE METABOLIC PANEL WITH GFR
AG Ratio: 1.1 (calc) (ref 1.0–2.5)
ALT: 9 U/L (ref 6–29)
AST: 15 U/L (ref 10–35)
Albumin: 3.9 g/dL (ref 3.6–5.1)
Alkaline phosphatase (APISO): 71 U/L (ref 37–153)
BUN: 11 mg/dL (ref 7–25)
CO2: 30 mmol/L (ref 20–32)
Calcium: 9.3 mg/dL (ref 8.6–10.4)
Chloride: 104 mmol/L (ref 98–110)
Creat: 0.68 mg/dL (ref 0.60–0.93)
GFR, Est African American: 100 mL/min/{1.73_m2} (ref >=60)
GFR, Est Non African American: 86 mL/min/{1.73_m2} (ref >=60)
Globulin: 3.6 g/dL (calc) (ref 1.9–3.7)
Glucose, Bld: 105 mg/dL — ABNORMAL HIGH (ref 65–99)
Potassium: 3.5 mmol/L (ref 3.5–5.3)
Sodium: 142 mmol/L (ref 135–146)
Total Bilirubin: 0.5 mg/dL (ref 0.2–1.2)
Total Protein: 7.5 g/dL (ref 6.1–8.1)

## 2020-09-09 LAB — CBC WITH DIFFERENTIAL/PLATELET
Absolute Monocytes: 485 cells/uL (ref 200–950)
Basophils Absolute: 63 cells/uL (ref 0–200)
Basophils Relative: 1 %
Eosinophils Absolute: 88 cells/uL (ref 15–500)
Eosinophils Relative: 1.4 %
HCT: 39.5 % (ref 35.0–45.0)
Hemoglobin: 13.4 g/dL (ref 11.7–15.5)
Lymphs Abs: 1701 cells/uL (ref 850–3900)
MCH: 30.5 pg (ref 27.0–33.0)
MCHC: 33.9 g/dL (ref 32.0–36.0)
MCV: 90 fL (ref 80.0–100.0)
MPV: 12.5 fL (ref 7.5–12.5)
Monocytes Relative: 7.7 %
Neutro Abs: 3963 cells/uL (ref 1500–7800)
Neutrophils Relative %: 62.9 %
Platelets: 283 10*3/uL (ref 140–400)
RBC: 4.39 10*6/uL (ref 3.80–5.10)
RDW: 12.1 % (ref 11.0–15.0)
Total Lymphocyte: 27 %
WBC: 6.3 10*3/uL (ref 3.8–10.8)

## 2020-09-09 LAB — LIPID PANEL
Cholesterol: 238 mg/dL — ABNORMAL HIGH (ref <200)
HDL: 57 mg/dL (ref >=50)
LDL Cholesterol (Calc): 156 mg/dL (calc) — ABNORMAL HIGH
Non-HDL Cholesterol (Calc): 181 mg/dL (calc) — ABNORMAL HIGH (ref <130)
Total CHOL/HDL Ratio: 4.2 (calc) (ref <5.0)
Triglycerides: 123 mg/dL (ref <150)

## 2020-09-09 LAB — HEMOGLOBIN A1C
Hgb A1c MFr Bld: 5.6 % of total Hgb (ref <5.7)
Mean Plasma Glucose: 114 mg/dL
eAG (mmol/L): 6.3 mmol/L

## 2020-09-09 LAB — VITAMIN D 25 HYDROXY (VIT D DEFICIENCY, FRACTURES): Vit D, 25-Hydroxy: 24 ng/mL — ABNORMAL LOW (ref 30–100)

## 2020-09-09 LAB — C-REACTIVE PROTEIN: CRP: 20.7 mg/L — ABNORMAL HIGH (ref <8.0)

## 2020-09-20 ENCOUNTER — Other Ambulatory Visit: Payer: Self-pay | Admitting: Family Medicine

## 2020-09-20 DIAGNOSIS — Z1231 Encounter for screening mammogram for malignant neoplasm of breast: Secondary | ICD-10-CM

## 2020-09-21 ENCOUNTER — Other Ambulatory Visit: Payer: Self-pay

## 2020-09-21 ENCOUNTER — Ambulatory Visit (INDEPENDENT_AMBULATORY_CARE_PROVIDER_SITE_OTHER): Payer: Medicare Other

## 2020-09-21 VITALS — BP 144/84 | HR 79 | Temp 98.1°F | Resp 16 | Ht 63.0 in | Wt 209.0 lb

## 2020-09-21 DIAGNOSIS — Z0100 Encounter for examination of eyes and vision without abnormal findings: Secondary | ICD-10-CM

## 2020-09-21 DIAGNOSIS — Z0000 Encounter for general adult medical examination without abnormal findings: Secondary | ICD-10-CM

## 2020-09-21 NOTE — Progress Notes (Signed)
Subjective:   Vanessa Hunter is a 75 y.o. female who presents for Medicare Annual (Subsequent) preventive examination.  Review of Systems     Cardiac Risk Factors include: advanced age (>6men, >9 women);dyslipidemia;obesity (BMI >30kg/m2)     Objective:    Today's Vitals   09/21/20 1050  BP: (!) 144/84  Pulse: 79  Resp: 16  Temp: 98.1 F (36.7 C)  TempSrc: Oral  SpO2: 98%  Weight: 209 lb (94.8 kg)  Height: 5\' 3"  (1.6 m)   Body mass index is 37.02 kg/m.  Advanced Directives 09/21/2020 09/19/2019 09/13/2018 11/22/2016 07/19/2016 06/10/2015  Does Patient Have a Medical Advance Directive? Yes Yes Yes Yes Yes Yes  Type of Paramedic of [place];Living will [place];Living will Living will Living will;Healthcare Power of Attorney Living will [place];Living will  Copy of [place] in Chart? No - copy requested No - copy requested - No - copy requested - No - copy requested    Current Medications (verified) Outpatient Encounter Medications as of 09/21/2020  Medication Sig  . alendronate (FOSAMAX) 35 MG tablet Take 1 tablet (35 mg total) by mouth every 7 (seven) days. Take with a full glass of water on an empty stomach.  . ezetimibe (ZETIA) 10 MG tablet Take 1 tablet (10 mg total) by mouth daily.  Marland Kitchen loratadine (CLARITIN) 10 MG tablet Take 1 tablet (10 mg total) by mouth daily.  . rosuvastatin (CRESTOR) 40 MG tablet Take 1 tablet (40 mg total) by mouth daily.  . valACYclovir (VALTREX) 1000 MG tablet Take 1 tablet (1,000 mg total) by mouth 2 (two) times daily.  . Vitamin D, Ergocalciferol, (DRISDOL) 1.25 MG (50000 UNIT) CAPS capsule Take 1 capsule (50,000 Units total) by mouth every 7 (seven) days.   No facility-administered encounter medications on file as of 09/21/2020.    Allergies (verified) Lac bovis and Orange juice [orange oil]   History: Past Medical History:  Diagnosis Date  . Abnormal  mammogram   . Allergy   . Hyperlipidemia   . Left shoulder pain   . Osteopenia   . Ovarian failure   . Vaginal atrophy    Past Surgical History:  Procedure Laterality Date  . BREAST BIOPSY Right 03-06-13   RIGHT BREAST AT 10:30, intramammary lymph node  . BREAST BIOPSY Right    neg  . CHOLECYSTECTOMY    . COLONOSCOPY    . TONSILLECTOMY    . TUBAL LIGATION     Family History  Problem Relation Age of Onset  . Hearing loss Mother   . Migraines Mother   . Heart disease Father   . Heart disease Brother   . Cancer Maternal Aunt        breast  . Breast cancer Maternal Aunt   . Colon cancer Other   . Cancer Other        Colon-Niece  . Hyperlipidemia Sister    Social History   Socioeconomic History  . Marital status: Widowed    Spouse name: Not on file  . Number of children: 1  . Years of education: Not on file  . Highest education level: Bachelor's degree (e.g., BA, AB, BS)  Occupational History  . 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 Smoking status: Never Smoker  . Smokeless tobacco: Never Used  Vaping Use  . Vaping Use: Never used  Substance and Sexual Activity  .  Alcohol use: Yes    Alcohol/week: 1.0 standard drink    Types: 1 Glasses of wine per week    Comment: occasionally  . Drug use: No  . Sexual activity: Yes  Other Topics Concern  . Not on file  Social History Narrative   Patient lives alone.    Son lives in [place].   She is originally from [place], moved to [place] and after retirement moved with husband to [place], but once her husband died in November 10, 2009 she moved back to [place] in 11/11/2011   Mother still living and also sister are here in [place].    She has two grandson's    Social Determinants of Health   Financial Resource Strain: Low Risk   . Difficulty of Paying Living Expenses: Not hard at all  Food Insecurity: No Food Insecurity  . Worried About Charity fundraiser in the Last Year: Never true  . Ran Out of Food in  the Last Year: Never true  Transportation Needs: No Transportation Needs  . Lack of Transportation (Medical): No  . Lack of Transportation (Non-Medical): No  Physical Activity: Sufficiently Active  . Days of Exercise per Week: 6 days  . Minutes of Exercise per Session: 30 min  Stress: No Stress Concern Present  . Feeling of Stress : Not at all  Social Connections: Moderately Isolated  . Frequency of Communication with Friends and Family: More than three times a week  . Frequency of Social Gatherings with Friends and Family: More than three times a week  . Attends Religious Services: More than 4 times per year  . Active Member of Clubs or Organizations: No  . Attends Archivist Meetings: Never  . Marital Status: Widowed    Tobacco Counseling Counseling given: Not Answered   Clinical Intake:  Pre-visit preparation completed: Yes  Pain : No/denies pain     BMI - recorded: 37.02 Nutritional Status: BMI > 30  Obese Nutritional Risks: None Diabetes: No  How often do you need to have someone help you when you read instructions, pamphlets, or other written materials from your doctor or pharmacy?: 1 - Never    Interpreter Needed?: No  Information entered by :: Clemetine Marker LPN   Activities of Daily Living In your present state of health, do you have any difficulty performing the following activities: 09/21/2020 09/08/2020  Hearing? N N  Comment declines hearing aids -  Vision? N N  Difficulty concentrating or making decisions? N N  Walking or climbing stairs? N N  Dressing or bathing? N N  Doing errands, shopping? N N  Preparing Food and eating ? N -  Using the Toilet? N -  In the past six months, have you accidently leaked urine? N -  Do you have problems with loss of bowel control? N -  Managing your Medications? N -  Managing your Finances? N -  Housekeeping or managing your Housekeeping? N -  Some recent data might be hidden    Patient Care  Team: Steele Sizer, MD as PCP - General (Family Medicine) Bary Castilla, Forest Gleason, MD (General Surgery) Steele Sizer, MD as Attending Physician (Family Medicine)  Indicate any recent Medical Services you may have received from other than [facility] providers in the past year (date may be approximate).     Assessment:   This is a routine wellness examination for [facility].  Hearing/Vision screen  Hearing Screening   125Hz  250Hz  500Hz  1000Hz  2000Hz  3000Hz  4000Hz  6000Hz  8000Hz   Right ear:  Left ear:           Comments: Pt denies hearing difficulty  Vision Screening Comments: Wears reading glasses; due for eye exam. Referral sent to opthalmology today.   Dietary issues and exercise activities discussed: Current Exercise Habits: Home exercise routine, Type of exercise: walking;calisthenics, Time (Minutes): 30, Frequency (Times/Week): 6, Weekly Exercise (Minutes/Week): 180, Intensity: Mild, Exercise limited by: None identified  Goals    . Weight (lb) < 200 lb (90.7 kg)     Pt would like to lose weight over the next year       Depression Screen PHQ 2/9 Scores 09/21/2020 09/08/2020 02/13/2020 09/25/2019 09/19/2019 08/22/2019 06/10/2019  PHQ - 2 Score 0 0 0 0 0 0 0  PHQ- 9 Score - - 0 0 - 0 0    Fall Risk Fall Risk  09/21/2020 09/08/2020 02/13/2020 09/25/2019 09/19/2019  Falls in the past year? 0 0 0 0 0  Number falls in past yr: 0 0 0 0 0  Injury with Fall? 0 0 0 0 0  Risk for fall due to : No Fall Risks - - - No Fall Risks  Follow up Falls prevention discussed - - - Falls prevention discussed    FALL RISK PREVENTION PERTAINING TO THE HOME:  Any stairs in or around the home? Yes  If so, are there any without handrails? No  Home free of loose throw rugs in walkways, pet beds, electrical cords, etc? Yes  Adequate lighting in your home to reduce risk of falls? Yes   ASSISTIVE DEVICES UTILIZED TO PREVENT FALLS:  Life alert? No  Use of a cane, walker or w/c? No  Grab bars in the bathroom? No   Shower chair or bench in shower? No  Elevated toilet seat or a handicapped toilet? No   TIMED UP AND GO:  Was the test performed? Yes .  Length of time to ambulate 10 feet: 5 sec.   Gait steady and fast without use of assistive device  Cognitive Function: Normal cognitive status assessed by direct observation by this Nurse Health Advisor. No abnormalities found.       6CIT Screen 09/19/2019 09/13/2018  What Year? 0 points 0 points  What month? 0 points 0 points  What time? 0 points 0 points  Count back from 20 0 points 0 points  Months in reverse 0 points 0 points  Repeat phrase 0 points 2 points  Total Score 0 2    Immunizations Immunization History  Administered Date(s) Administered  . PFIZER(Purple Top)SARS-COV-2 Vaccination 11/01/2019, 11/26/2019, 06/28/2020  . Pneumococcal Conjugate-13 07/19/2016  . Pneumococcal Polysaccharide-23 01/28/2013    TDAP status: Due, Education has been provided regarding the importance of this vaccine. Advised may receive this vaccine at local pharmacy or Health Dept. Aware to provide a copy of the vaccination record if obtained from local pharmacy or Health Dept. Verbalized acceptance and understanding.  Flu Vaccine status: Declined, Education has been provided regarding the importance of this vaccine but patient still declined. Advised may receive this vaccine at local pharmacy or Health Dept. Aware to provide a copy of the vaccination record if obtained from local pharmacy or Health Dept. Verbalized acceptance and understanding.  Pneumococcal vaccine status: Completed during today's visit.  Covid-19 vaccine status: Completed vaccines  Qualifies for Shingles Vaccine? Yes   Zostavax completed No   Shingrix Completed?: No.    Education has been provided regarding the importance of this vaccine. Patient has been advised to call insurance company to  determine out of pocket expense if they have not yet received this vaccine. Advised may also  receive vaccine at local pharmacy or Health Dept. Verbalized acceptance and understanding.  Screening Tests Health Maintenance  Topic Date Due  . TETANUS/TDAP  09/14/2021 (Originally 07/30/1965)  . MAMMOGRAM  10/19/2021  . COLONOSCOPY (Pts 45-11yrs Insurance coverage will need to be confirmed)  02/25/2023  . DEXA SCAN  Completed  . COVID-19 Vaccine  Completed  . Hepatitis C Screening  Completed  . PNA vac Low Risk Adult  Completed  . INFLUENZA VACCINE  Discontinued    Health Maintenance  There are no preventive care reminders to display for this patient.  Colorectal cancer screening: Type of screening: Colonoscopy. Completed 02/24/13. Repeat every 10 years  Mammogram status: Completed 10/20/19. Repeat every year. Scheduled for 10/20/20.   Bone Density status: Completed 10/22/19. Results reflect: Bone density results: OSTEOPENIA. Repeat every 2 years.  Lung Cancer Screening: (Low Dose CT Chest recommended if Age 18-80 years, 30 pack-year currently smoking OR have quit w/in 15years.) does not qualify.   Additional Screening:  Hepatitis C Screening: does qualify; Completed 01/28/13  Vision Screening: Recommended annual ophthalmology exams for early detection of glaucoma and other disorders of the eye. Is the patient up to date with their annual eye exam?  No  Who is the provider or what is the name of the office in which the patient attends annual eye exams? Not established If pt is not established with a provider, would they like to be referred to a provider to establish care? Yes .   Dental Screening: Recommended annual dental exams for proper oral hygiene  Community Resource Referral / Chronic Care Management: CRR required this visit?  No   CCM required this visit?  No      Plan:     I have personally reviewed and noted the following in the patient's chart:   . Medical and social history . Use of alcohol, tobacco or illicit drugs  . Current medications and  supplements . Functional ability and status . Nutritional status . Physical activity . Advanced directives . List of other physicians . Hospitalizations, surgeries, and ER visits in previous 12 months . Vitals . Screenings to include cognitive, depression, and falls . Referrals and appointments  In addition, I have reviewed and discussed with patient certain preventive protocols, quality metrics, and best practice recommendations. A written personalized care plan for preventive services as well as general preventive health recommendations were provided to patient.     Clemetine Marker, LPN   10/15/368   Nurse Notes: none

## 2020-09-21 NOTE — Patient Instructions (Signed)
[name] , Thank you for taking time to come for your Medicare Wellness Visit. I appreciate your ongoing commitment to your health goals. Please review the following plan we discussed and let me know if I can assist you in the future.   Screening recommendations/referrals: Colonoscopy: done 02/24/13. Repeat in 2024 Mammogram: done 10/20/19 Bone Density: done 10/22/19 Recommended yearly ophthalmology/optometry visit for glaucoma screening and checkup Recommended yearly dental visit for hygiene and checkup  Vaccinations: Influenza vaccine: declined Pneumococcal vaccine: done 07/19/16 Tdap vaccine: due Shingles vaccine: Shingrix discussed. Please contact your pharmacy for coverage information.  Covid-19: done 11/01/19, 11/26/19 & 06/28/20  Advanced directives: Please bring a copy of your health care power of attorney and living will to the office at your convenience.  Conditions/risks identified: Recommend healthy eating and physical activity for desired weight loss  Next appointment: Follow up in one year for your annual wellness visit    Preventive Care 65 Years and Older, Female Preventive care refers to lifestyle choices and visits with your health care provider that can promote health and wellness. What does preventive care include?  A yearly physical exam. This is also called an annual well check.  Dental exams once or twice a year.  Routine eye exams. Ask your health care provider how often you should have your eyes checked.  Personal lifestyle choices, including:  Daily care of your teeth and gums.  Regular physical activity.  Eating a healthy diet.  Avoiding tobacco and drug use.  Limiting alcohol use.  Practicing safe sex.  Taking low-dose aspirin every day.  Taking vitamin and mineral supplements as recommended by your health care provider. What happens during an annual well check? The services and screenings done by your health care provider during your annual  well check will depend on your age, overall health, lifestyle risk factors, and family history of disease. Counseling  Your health care provider may ask you questions about your:  Alcohol use.  Tobacco use.  Drug use.  Emotional well-being.  Home and relationship well-being.  Sexual activity.  Eating habits.  History of falls.  Memory and ability to understand (cognition).  Work and work Statistician.  Reproductive health. Screening  You may have the following tests or measurements:  Height, weight, and BMI.  Blood pressure.  Lipid and cholesterol levels. These may be checked every 5 years, or more frequently if you are over 72 years old.  Skin check.  Lung cancer screening. You may have this screening every year starting at age 43 if you have a 30-pack-year history of smoking and currently smoke or have quit within the past 15 years.  Fecal occult blood test (FOBT) of the stool. You may have this test every year starting at age 29.  Flexible sigmoidoscopy or colonoscopy. You may have a sigmoidoscopy every 5 years or a colonoscopy every 10 years starting at age 71.  Hepatitis C blood test.  Hepatitis B blood test.  Sexually transmitted disease (STD) testing.  Diabetes screening. This is done by checking your blood sugar (glucose) after you have not eaten for a while (fasting). You may have this done every 1-3 years.  Bone density scan. This is done to screen for osteoporosis. You may have this done starting at age 31.  Mammogram. This may be done every 1-2 years. Talk to your health care provider about how often you should have regular mammograms. Talk with your health care provider about your test results, treatment options, and if necessary, the need  for more tests. Vaccines  Your health care provider may recommend certain vaccines, such as:  Influenza vaccine. This is recommended every year.  Tetanus, diphtheria, and acellular pertussis (Tdap, Td) vaccine.  You may need a Td booster every 10 years.  Zoster vaccine. You may need this after age 76.  Pneumococcal 13-valent conjugate (PCV13) vaccine. One dose is recommended after age 90.  Pneumococcal polysaccharide (PPSV23) vaccine. One dose is recommended after age 69. Talk to your health care provider about which screenings and vaccines you need and how often you need them. This information is not intended to replace advice given to you by your health care provider. Make sure you discuss any questions you have with your health care provider. Document Released: 08/27/2015 Document Revised: 04/19/2016 Document Reviewed: 06/01/2015 Elsevier Interactive Patient Education  2017 [place] Prevention in the Home Falls can cause injuries. They can happen to people of all ages. There are many things you can do to make your home safe and to help prevent falls. What can I do on the outside of my home?  Regularly fix the edges of walkways and driveways and fix any cracks.  Remove anything that might make you trip as you walk through a door, such as a raised step or threshold.  Trim any bushes or trees on the path to your home.  Use bright outdoor lighting.  Clear any walking paths of anything that might make someone trip, such as rocks or tools.  Regularly check to see if handrails are loose or broken. Make sure that both sides of any steps have handrails.  Any raised decks and porches should have guardrails on the edges.  Have any leaves, snow, or ice cleared regularly.  Use sand or salt on walking paths during winter.  Clean up any spills in your garage right away. This includes oil or grease spills. What can I do in the bathroom?  Use night lights.  Install grab bars by the toilet and in the tub and shower. Do not use towel bars as grab bars.  Use non-skid mats or decals in the tub or shower.  If you need to sit down in the shower, use a plastic, non-slip stool.  Keep the  floor dry. Clean up any water that spills on the floor as soon as it happens.  Remove soap buildup in the tub or shower regularly.  Attach bath mats securely with double-sided non-slip rug tape.  Do not have throw rugs and other things on the floor that can make you trip. What can I do in the bedroom?  Use night lights.  Make sure that you have a light by your bed that is easy to reach.  Do not use any sheets or blankets that are too big for your bed. They should not hang down onto the floor.  Have a firm chair that has side arms. You can use this for support while you get dressed.  Do not have throw rugs and other things on the floor that can make you trip. What can I do in the kitchen?  Clean up any spills right away.  Avoid walking on wet floors.  Keep items that you use a lot in easy-to-reach places.  If you need to reach something above you, use a strong step stool that has a grab bar.  Keep electrical cords out of the way.  Do not use floor polish or wax that makes floors slippery. If you must use wax, use  non-skid floor wax.  Do not have throw rugs and other things on the floor that can make you trip. What can I do with my stairs?  Do not leave any items on the stairs.  Make sure that there are handrails on both sides of the stairs and use them. Fix handrails that are broken or loose. Make sure that handrails are as long as the stairways.  Check any carpeting to make sure that it is firmly attached to the stairs. Fix any carpet that is loose or worn.  Avoid having throw rugs at the top or bottom of the stairs. If you do have throw rugs, attach them to the floor with carpet tape.  Make sure that you have a light switch at the top of the stairs and the bottom of the stairs. If you do not have them, ask someone to add them for you. What else can I do to help prevent falls?  Wear shoes that:  Do not have high heels.  Have rubber bottoms.  Are comfortable and fit  you well.  Are closed at the toe. Do not wear sandals.  If you use a stepladder:  Make sure that it is fully opened. Do not climb a closed stepladder.  Make sure that both sides of the stepladder are locked into place.  Ask someone to hold it for you, if possible.  Clearly mark and make sure that you can see:  Any grab bars or handrails.  First and last steps.  Where the edge of each step is.  Use tools that help you move around (mobility aids) if they are needed. These include:  Canes.  Walkers.  Scooters.  Crutches.  Turn on the lights when you go into a dark area. Replace any light bulbs as soon as they burn out.  Set up your furniture so you have a clear path. Avoid moving your furniture around.  If any of your floors are uneven, fix them.  If there are any pets around you, be aware of where they are.  Review your medicines with your doctor. Some medicines can make you feel dizzy. This can increase your chance of falling. Ask your doctor what other things that you can do to help prevent falls. This information is not intended to replace advice given to you by your health care provider. Make sure you discuss any questions you have with your health care provider. Document Released: 05/27/2009 Document Revised: 01/06/2016 Document Reviewed: 09/04/2014 Elsevier Interactive Patient Education  2017 [employer].

## 2020-10-20 ENCOUNTER — Ambulatory Visit
Admission: RE | Admit: 2020-10-20 | Discharge: 2020-10-20 | Disposition: A | Discharge status: Home / Self Care | Payer: Medicare Other | Source: Ambulatory Visit | Attending: Family Medicine | Admitting: Family Medicine

## 2020-10-20 ENCOUNTER — Other Ambulatory Visit: Payer: Self-pay

## 2020-10-20 DIAGNOSIS — Z1231 Encounter for screening mammogram for malignant neoplasm of breast: Secondary | ICD-10-CM | POA: Insufficient documentation

## 2021-02-14 DIAGNOSIS — Z20822 Contact with and (suspected) exposure to covid-19: Secondary | ICD-10-CM | POA: Diagnosis not present

## 2021-02-21 NOTE — Progress Notes (Deleted)
Name: MERI PELOT   MRN: [id number]    DOB: [birth year]   Date:02/21/2021       Progress Note  Subjective  Chief Complaint  Annual Exam  HPI  Patient presents for annual CPE.  Diet: *** Exercise: ***   Flowsheet Row Office Visit from 02/13/2020 in [facility]  AUDIT-C Score 0      Depression: Phq 9 is  {Desc; negative/positive:13464} Depression screen [facility] 2/9 09/21/2020 09/08/2020 02/13/2020 09/25/2019 09/19/2019  Decreased Interest 0 0 0 0 0  Down, Depressed, Hopeless 0 0 0 0 0  PHQ - 2 Score 0 0 0 0 0  Altered sleeping - - 0 0 -  Tired, decreased energy - - 0 0 -  Change in appetite - - 0 0 -  Feeling bad or failure about yourself  - - 0 0 -  Trouble concentrating - - 0 0 -  Moving slowly or fidgety/restless - - 0 0 -  Suicidal thoughts - - 0 0 -  PHQ-9 Score - - 0 0 -  Difficult doing work/chores - - - Not difficult at all -  Some recent data might be hidden   Hypertension: BP Readings from Last 3 Encounters:  09/21/20 (!) 144/84  09/08/20 138/82  02/13/20 124/78   Obesity: Wt Readings from Last 3 Encounters:  09/21/20 209 lb (94.8 kg)  09/08/20 211 lb 3.2 oz (95.8 kg)  02/13/20 206 lb 11.2 oz (93.8 kg)   BMI Readings from Last 3 Encounters:  09/21/20 37.02 kg/m  09/08/20 37.41 kg/m  02/13/20 36.62 kg/m     Vaccines:  HPV: up to at age 75 , ask insurance if age between 66-45  Shingrix: 75-75 yo and ask insurance if covered when patient above 52 yo Pneumonia: educated and discussed with patient. Flu: educated and discussed with patient.  Hep C Screening: 01/28/13 STD testing and prevention (HIV/chl/gon/syphilis): N/A Intimate partner violence:negative Sexual History : Menstrual History/LMP/Abnormal Bleeding:  Incontinence Symptoms:   Breast cancer:  - Last Mammogram: 6*7*12 - BRCA gene screening: N/A  Osteoporosis: Discussed high calcium and vitamin D supplementation, weight bearing exercises  Cervical cancer screening:  N/A  Skin cancer: Discussed monitoring for atypical lesions  Colorectal cancer: 02/24/13   Lung cancer:  Low Dose CT Chest recommended if Age 75-75 years, 20 pack-year currently smoking OR have quit w/in 15years. Patient does not qualify.   ECG: 07/19/16  Advanced Care Planning: A voluntary discussion about advance care planning including the explanation and discussion of advance directives.  Discussed health care proxy and Living will, and the patient was able to identify a health care proxy as ***.  Patient does not have a living will at present time. If patient does have living will, I have requested they bring this to the clinic to be scanned in to their chart.  Lipids: Lab Results  Component Value Date   CHOL 238 (H) 09/08/2020   CHOL 244 (H) 09/17/2019   CHOL 202 (H) 09/23/2018   Lab Results  Component Value Date   HDL 57 09/08/2020   HDL 64 09/17/2019   HDL 63 09/23/2018   Lab Results  Component Value Date   LDLCALC 156 (H) 09/08/2020   LDLCALC 157 (H) 09/17/2019   LDLCALC 120 (H) 09/23/2018   Lab Results  Component Value Date   TRIG 123 09/08/2020   TRIG 115 09/17/2019   TRIG 87 09/23/2018   Lab Results  Component Value Date   CHOLHDL 4.2 09/08/2020  CHOLHDL 3.8 09/17/2019   CHOLHDL 3.2 09/23/2018   No results found for: LDLDIRECT  Glucose: Glucose, Bld  Date Value Ref Range Status  09/08/2020 105 (H) 65 - 99 mg/dL Final    Comment:    .            Fasting reference interval . For someone without known diabetes, a glucose value between 100 and 125 mg/dL is consistent with prediabetes and should be confirmed with a follow-up test. .   09/17/2019 102 (H) 65 - 99 mg/dL Final    Comment:    .            Fasting reference interval . For someone without known diabetes, a glucose value between 100 and 125 mg/dL is consistent with prediabetes and should be confirmed with a follow-up test. .   06/30/2019 95 65 - 99 mg/dL Final    Comment:    .             Fasting reference interval .     Patient Active Problem List   Diagnosis Date Noted   Senile purpura ([place]) 03/07/2018   Elevated C-reactive protein 07/28/2017   Allergic rhinitis 06/10/2015   Dyslipidemia 06/10/2015   Osteopenia 06/10/2015   Pain in shoulder 06/10/2015   Atrophy of vagina 06/10/2015   History of diverticulitis 06/10/2015    Past Surgical History:  Procedure Laterality Date   BREAST BIOPSY Right 03-06-13   RIGHT BREAST AT 10:30, intramammary lymph node   BREAST BIOPSY Right    neg   CHOLECYSTECTOMY     COLONOSCOPY     TONSILLECTOMY     TUBAL LIGATION      Family History  Problem Relation Age of Onset   Hearing loss Mother    Migraines Mother    Heart disease Father    Heart disease Brother    Cancer Maternal Aunt        breast   Breast cancer Maternal Aunt    Colon cancer Other    Cancer Other        Colon-Niece   Hyperlipidemia Sister     Social History   Socioeconomic History   Marital status: Widowed    Spouse name: Not on file   Number of children: 1   Years of education: Not on file   Highest education level: Bachelor's degree (e.g., BA, AB, BS)  Occupational History   Occupation: reitred     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    Son lives in [place].   She is originally from [place], moved to [place] and after retirement moved with husband to [place], but once her husband died in 11-07-2009 she moved back to [place] in 2011-11-08   Mother still living and also sister are here in [place].    She has two grandson's    Social Determinants of Radio broadcast assistant  Strain: Low Risk    Difficulty of Paying Living Expenses: Not hard at all  Food Insecurity: No Food  Insecurity   Worried About Charity fundraiser in the Last Year: Never true   [place] in the Last Year: Never true  Transportation Needs: No Transportation Needs   Lack of Transportation (Medical): No   Lack of Transportation (Non-Medical): No  Physical Activity: Sufficiently Active   Days of Exercise per Week: 6 days   Minutes of Exercise per Session: 30 min  Stress: No Stress Concern Present   Feeling of Stress : Not at all  Social Connections: Moderately Isolated   Frequency of Communication with Friends and Family: More than three times a week   Frequency of Social Gatherings with Friends and Family: More than three times a week   Attends Religious Services: More than 4 times per year   Active Member of [employer] or Organizations: No   Attends Archivist Meetings: Never   Marital Status: Widowed  Human resources officer Violence: Not At Risk   Fear of Current or Ex-Partner: No   Emotionally Abused: No   Physically Abused: No   Sexually Abused: No     Current Outpatient Medications:    alendronate (FOSAMAX) 35 MG tablet, Take 1 tablet (35 mg total) by mouth every 7 (seven) days. Take with a full glass of water on an empty stomach., Disp: 12 tablet, Rfl: 3   ezetimibe (ZETIA) 10 MG tablet, Take 1 tablet (10 mg total) by mouth daily., Disp: 90 tablet, Rfl: 3   loratadine (CLARITIN) 10 MG tablet, Take 1 tablet (10 mg total) by mouth daily., Disp: 100 tablet, Rfl: 0   rosuvastatin (CRESTOR) 40 MG tablet, Take 1 tablet (40 mg total) by mouth daily., Disp: 90 tablet, Rfl: 3   valACYclovir (VALTREX) 1000 MG tablet, Take 1 tablet (1,000 mg total) by mouth 2 (two) times daily., Disp: 6 tablet, Rfl: 0   Vitamin D, Ergocalciferol, (DRISDOL) 1.25 MG (50000 UNIT) CAPS capsule, Take 1 capsule (50,000 Units total) by mouth every 7 (seven) days., Disp: 12 capsule, Rfl: 3  Allergies   Allergen Reactions   Lac Bovis     lactose intolerance   Orange Juice [Orange Oil] Itching     ROS  ***  Objective  There were no vitals filed for this visit.  There is no height or weight on file to calculate BMI.  Physical Exam ***  No results found for this or any previous visit (from the past 2160 hour(s)).  Diabetic Foot Exam: Diabetic Foot Exam - Simple   No data filed    ***  Fall Risk: Fall Risk  09/21/2020 09/08/2020 02/13/2020 09/25/2019 09/19/2019  Falls in the past year? 0 0 0 0 0  Number falls in past yr: 0 0 0 0 0  Injury with Fall? 0 0 0 0 0  Risk for fall due to : No Fall Risks - - - No Fall Risks  Follow up Falls prevention discussed - - - Falls prevention discussed   ***  Functional Status Survey:   ***  Assessment & Plan  1. Well adult exam ***   -USPSTF grade A and B recommendations reviewed with patient; age-appropriate recommendations, preventive care, screening tests, etc discussed and encouraged; healthy living encouraged; see AVS for patient education given to patient -Discussed importance of 150 minutes of physical activity weekly, eat two servings of fish weekly, eat one serving of tree nuts ( cashews, pistachios, pecans, almonds.Marland Kitchen) every other day, eat 6 servings of fruit/vegetables daily and drink plenty of water and avoid sweet beverages.   -  Reviewed Health Maintenance: ***

## 2021-02-22 ENCOUNTER — Encounter: Admitting: Family Medicine

## 2021-03-17 DIAGNOSIS — H2513 Age-related nuclear cataract, bilateral: Secondary | ICD-10-CM | POA: Diagnosis not present

## 2021-06-22 DIAGNOSIS — Z20828 Contact with and (suspected) exposure to other viral communicable diseases: Secondary | ICD-10-CM | POA: Diagnosis not present

## 2021-09-19 DIAGNOSIS — Z20822 Contact with and (suspected) exposure to covid-19: Secondary | ICD-10-CM | POA: Diagnosis not present

## 2021-09-22 ENCOUNTER — Ambulatory Visit: Payer: Medicare Other

## 2021-10-10 ENCOUNTER — Other Ambulatory Visit: Payer: Self-pay | Admitting: Family Medicine

## 2021-10-10 DIAGNOSIS — Z1231 Encounter for screening mammogram for malignant neoplasm of breast: Secondary | ICD-10-CM

## 2021-10-26 ENCOUNTER — Other Ambulatory Visit: Payer: Self-pay

## 2021-10-26 ENCOUNTER — Ambulatory Visit
Admission: RE | Admit: 2021-10-26 | Discharge: 2021-10-26 | Disposition: A | Discharge status: Home / Self Care | Payer: Medicare Other | Source: Ambulatory Visit | Attending: Family Medicine | Admitting: Family Medicine

## 2021-10-26 DIAGNOSIS — Z1231 Encounter for screening mammogram for malignant neoplasm of breast: Secondary | ICD-10-CM | POA: Diagnosis not present

## 2021-10-28 DIAGNOSIS — Z20822 Contact with and (suspected) exposure to covid-19: Secondary | ICD-10-CM | POA: Diagnosis not present

## 2021-11-18 DIAGNOSIS — Z1152 Encounter for screening for COVID-19: Secondary | ICD-10-CM | POA: Diagnosis not present

## 2021-11-18 DIAGNOSIS — Z20828 Contact with and (suspected) exposure to other viral communicable diseases: Secondary | ICD-10-CM | POA: Diagnosis not present

## 2021-11-28 DIAGNOSIS — Z20822 Contact with and (suspected) exposure to covid-19: Secondary | ICD-10-CM | POA: Diagnosis not present

## 2021-12-06 DIAGNOSIS — Z20822 Contact with and (suspected) exposure to covid-19: Secondary | ICD-10-CM | POA: Diagnosis not present

## 2021-12-06 DIAGNOSIS — R051 Acute cough: Secondary | ICD-10-CM | POA: Diagnosis not present

## 2021-12-06 DIAGNOSIS — R059 Cough, unspecified: Secondary | ICD-10-CM | POA: Diagnosis not present

## 2022-03-27 ENCOUNTER — Ambulatory Visit
Admission: EM | Admit: 2022-03-27 | Discharge: 2022-03-27 | Disposition: A | Discharge status: Home / Self Care | Payer: Medicare Other | Attending: Internal Medicine | Admitting: Internal Medicine

## 2022-03-27 ENCOUNTER — Encounter: Payer: Self-pay | Admitting: Emergency Medicine

## 2022-03-27 DIAGNOSIS — J069 Acute upper respiratory infection, unspecified: Secondary | ICD-10-CM | POA: Diagnosis not present

## 2022-03-27 DIAGNOSIS — J029 Acute pharyngitis, unspecified: Secondary | ICD-10-CM

## 2022-03-27 MED ORDER — BENZONATATE 200 MG PO CAPS: 200.0000 mg | ORAL_CAPSULE | Freq: Three times a day (TID) | ORAL | 0 refills | Status: DC | PRN

## 2022-03-27 NOTE — Discharge Instructions (Signed)
You have a virus and antibiotics will not help If you develop a fever of 100.5 or more and get worse, please come back.

## 2022-03-27 NOTE — ED Provider Notes (Signed)
[facility]    CSN: [id number] Arrival date & time: 03/27/22  0930      History   Chief Complaint Chief Complaint  Patient presents with   Cough    HPI Vanessa Hunter is a 76 y.o. female who presents with cough and hoarseness x 5 days. She does not feel bad, denies aches, HA, SOB, decreased appetite or fatigue. Her cough is productive on occasion with clear mucous. Her family heard her on the phone and advised her to come to be checked.     Past Medical History:  Diagnosis Date   Abnormal mammogram    Allergy    Hyperlipidemia    Left shoulder pain    Osteopenia    Ovarian failure    Vaginal atrophy     Patient Active Problem List   Diagnosis Date Noted   Senile purpura ([place]) 03/07/2018   Elevated C-reactive protein 07/28/2017   Allergic rhinitis 06/10/2015   Dyslipidemia 06/10/2015   Osteopenia 06/10/2015   Pain in shoulder 06/10/2015   Atrophy of vagina 06/10/2015   History of diverticulitis 06/10/2015    Past Surgical History:  Procedure Laterality Date   BREAST BIOPSY Right 03-06-13   RIGHT BREAST AT 10:30, intramammary lymph node   BREAST BIOPSY Right    neg   CHOLECYSTECTOMY     COLONOSCOPY     TONSILLECTOMY     TUBAL LIGATION      OB History     Gravida  1   Para  1   Term      Preterm      AB      Living  1      SAB      IAB      Ectopic      Multiple      Live Births           Obstetric Comments  1st Menstrual Cycle:  11 1st Pregnancy:  24          Home Medications    Prior to Admission medications   Medication Sig Start Date End Date Taking? Authorizing Provider  benzonatate (TESSALON) 200 MG capsule Take 1 capsule (200 mg total) by mouth 3 (three) times daily as needed. 03/27/22  Yes Rodriguez-Southworth, Sunday Spillers, PA-C  alendronate (FOSAMAX) 35 MG tablet Take 1 tablet (35 mg total) by mouth every 7 (seven) days. Take with a full glass of water on an empty stomach. 09/08/20   Steele Sizer, MD   ezetimibe (ZETIA) 10 MG tablet Take 1 tablet (10 mg total) by mouth daily. 09/08/20   Steele Sizer, MD  loratadine (CLARITIN) 10 MG tablet Take 1 tablet (10 mg total) by mouth daily. 11/08/18   Steele Sizer, MD  rosuvastatin (CRESTOR) 40 MG tablet Take 1 tablet (40 mg total) by mouth daily. 09/08/20   Steele Sizer, MD  valACYclovir (VALTREX) 1000 MG tablet Take 1 tablet (1,000 mg total) by mouth 2 (two) times daily. 11/08/18   Steele Sizer, MD  Vitamin D, Ergocalciferol, (DRISDOL) 1.25 MG (50000 UNIT) CAPS capsule Take 1 capsule (50,000 Units total) by mouth every 7 (seven) days. 09/08/20   Steele Sizer, MD    Family History Family History  Problem Relation Age of Onset   Hearing loss Mother    Migraines Mother    Heart disease Father    Heart disease Brother    Cancer Maternal Aunt        breast   Breast cancer Maternal Aunt  Colon cancer Other    Cancer Other        Colon-Niece   Hyperlipidemia Sister     Social History Social History   Tobacco Use   Smoking status: Never   Smokeless tobacco: Never  Vaping Use   Vaping Use: Never used  Substance Use Topics   Alcohol use: Yes    Alcohol/week: 1.0 standard drink of alcohol    Types: 1 Glasses of wine per week    Comment: occasionally   Drug use: No     Allergies   Milk (cow) and Orange juice [orange oil]   Review of Systems Review of Systems  Constitutional:  Negative for appetite change, diaphoresis, fatigue and fever.  HENT:  Positive for voice change. Negative for congestion, ear discharge, ear pain, postnasal drip, rhinorrhea, sore throat and trouble swallowing.   Eyes:  Negative for discharge.  Respiratory:  Positive for cough. Negative for chest tightness and shortness of breath.   Musculoskeletal:  Negative for myalgias.  Neurological:  Negative for headaches.     Physical Exam Triage Vital Signs ED Triage Vitals  Enc Vitals Group     BP 03/27/22 0940 (!) 140/70     Pulse Rate 03/27/22  0940 95     Resp 03/27/22 0940 16     Temp 03/27/22 0940 98.2 F (36.8 C)     Temp Source 03/27/22 0940 Oral     SpO2 03/27/22 0940 97 %     Weight --      Height --      Head Circumference --      Peak Flow --      Pain Score 03/27/22 0939 0     Pain Loc --      Pain Edu? --      Excl. in [place]? --    No data found.  Updated Vital Signs BP (!) 140/70 (BP Location: Left Arm)   Pulse 95   Temp 98.2 F (36.8 C) (Oral)   Resp 16   SpO2 97%   Visual Acuity Right Eye Distance:   Left Eye Distance:   Bilateral Distance:    Right Eye Near:   Left Eye Near:    Bilateral Near:     Physical Exam Vitals signs and nursing note reviewed.  Constitutional:      General: She is not in acute distress.    Appearance: Normal appearance. She is not ill-appearing, toxic-appearing or diaphoretic.  HENT:     Head: Normocephalic.     Right Ear: Tympanic membrane, ear canal and external ear normal.     Left Ear: Tympanic membrane, ear canal and external ear normal.     Nose: Nose normal.     Mouth/Throat:     Mouth: Mucous membranes are moist.  Eyes:     General: No scleral icterus.       Right eye: No discharge.        Left eye: No discharge.     Conjunctiva/sclera: Conjunctivae normal.  Neck:     Musculoskeletal: Neck supple. No neck rigidity.  Cardiovascular:     Rate and Rhythm: Normal rate and regular rhythm.     Heart sounds: No murmur.  Pulmonary:     Effort: Pulmonary effort is normal.     Breath sounds: Normal breath sounds.   Musculoskeletal: Normal range of motion.  Lymphadenopathy:     Cervical: No cervical adenopathy.  Skin:    General: Skin is warm and dry.  Coloration: Skin is not jaundiced.     Findings: No rash.  Neurological:     Mental Status: She is alert and oriented to person, place, and time.     Gait: Gait normal.  Psychiatric:        Mood and Affect: Mood normal.        Behavior: Behavior normal.        Thought Content: Thought content normal.         Judgment: Judgment normal.    UC Treatments / Results  Labs (all labs ordered are listed, but only abnormal results are displayed) Labs Reviewed - No data to display  EKG   Radiology No results found.  Procedures Procedures (including critical care time)  Medications Ordered in UC Medications - No data to display  Initial Impression / Assessment and Plan / UC Course  I have reviewed the triage vital signs and the nursing notes. URI Laryngitis  I placed her on Tessalon as noted. See instructions.      Final Clinical Impressions(s) / UC Diagnoses   Final diagnoses:  Viral URI with cough  Acute viral pharyngitis     Discharge Instructions      You have a virus and antibiotics will not help If you develop a fever of 100.5 or more and get worse, please come back.       ED Prescriptions     Medication Sig Dispense Auth. Provider   benzonatate (TESSALON) 200 MG capsule Take 1 capsule (200 mg total) by mouth 3 (three) times daily as needed. 30 capsule Rodriguez-Southworth, Sunday Spillers, PA-C      PDMP not reviewed this encounter.   Shelby Mattocks, PA-C 03/27/22 1029

## 2022-03-27 NOTE — ED Triage Notes (Signed)
Pt presents with a cough and hoarseness x 5 days

## 2022-05-12 DIAGNOSIS — H2513 Age-related nuclear cataract, bilateral: Secondary | ICD-10-CM | POA: Diagnosis not present

## 2023-01-10 ENCOUNTER — Ambulatory Visit
Admission: EM | Admit: 2023-01-10 | Discharge: 2023-01-10 | Disposition: A | Discharge status: Home / Self Care | Payer: Medicare Other | Attending: Physician Assistant | Admitting: Physician Assistant

## 2023-01-10 DIAGNOSIS — Z789 Other specified health status: Secondary | ICD-10-CM

## 2023-01-10 DIAGNOSIS — T22212A Burn of second degree of left forearm, initial encounter: Secondary | ICD-10-CM

## 2023-01-10 DIAGNOSIS — Z23 Encounter for immunization: Secondary | ICD-10-CM | POA: Diagnosis not present

## 2023-01-10 MED ORDER — TETANUS-DIPHTH-ACELL PERTUSSIS 5-2.5-18.5 LF-MCG/0.5 IM SUSY
0.5000 mL | PREFILLED_SYRINGE | Freq: Once | INTRAMUSCULAR | Status: AC
  Administered 2023-01-10: 0.5 mL via INTRAMUSCULAR

## 2023-01-10 MED ORDER — SILVER SULFADIAZINE 1 % EX CREA: 1.0000 | TOPICAL_CREAM | Freq: Two times a day (BID) | CUTANEOUS | 0 refills | Status: AC

## 2023-01-10 NOTE — Discharge Instructions (Addendum)
-  Clean the area daily with soap and water and apply Silvadene cream sparingly over the areas where the blisters have opened. - Try to pop any blisters.  They will likely open on their own in the next few days. - If any increased mass, swelling, pustular drainage or worsening pain, return for reevaluation to see if there is any evidence of infection. - Tylenol as needed for pain relief. - Tetanus was updated today.
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
## 2023-05-18 DIAGNOSIS — H2513 Age-related nuclear cataract, bilateral: Secondary | ICD-10-CM | POA: Diagnosis not present

## 2023-05-18 DIAGNOSIS — H524 Presbyopia: Secondary | ICD-10-CM | POA: Diagnosis not present

## 2023-12-29 IMAGING — MG MM DIGITAL SCREENING BILAT W/ TOMO AND CAD
8 series · 8 of 24 positions shown · non-contrast
Comparison: Previous exam(s).

ACR Breast Density Category a: The breast tissue is almost entirely
fatty.

CLINICAL DATA: Screening.

EXAM:
DIGITAL SCREENING BILATERAL MAMMOGRAM WITH TOMOSYNTHESIS AND CAD
TECHNIQUE: Bilateral screening digital craniocaudal and mediolateral oblique
mammograms were obtained. Bilateral screening digital breast
tomosynthesis was performed. The images were evaluated with
computer-aided detection.

[L CC synth-2D]
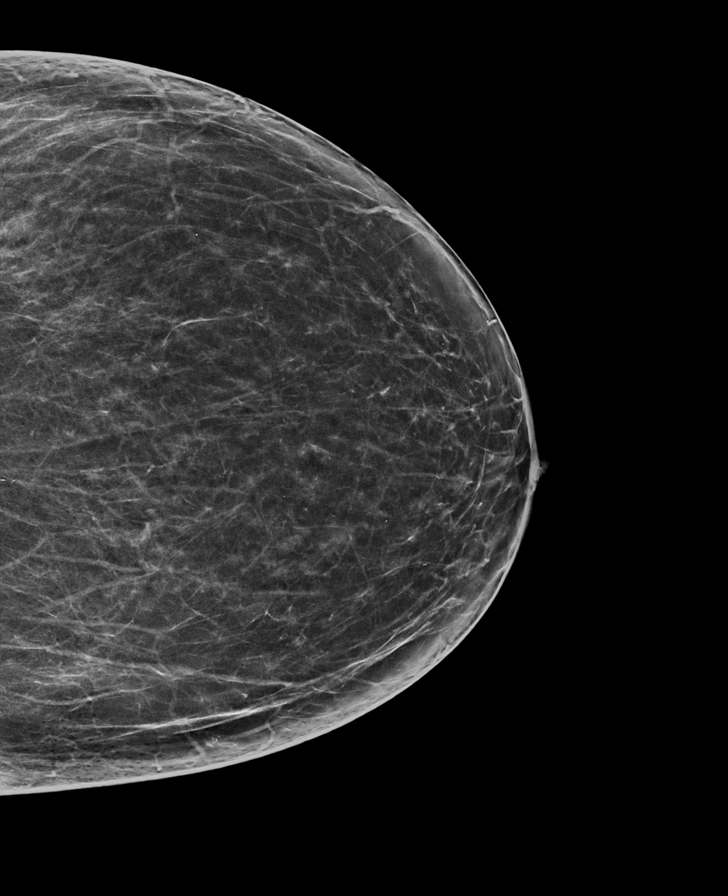

[L MLO synth-2D]
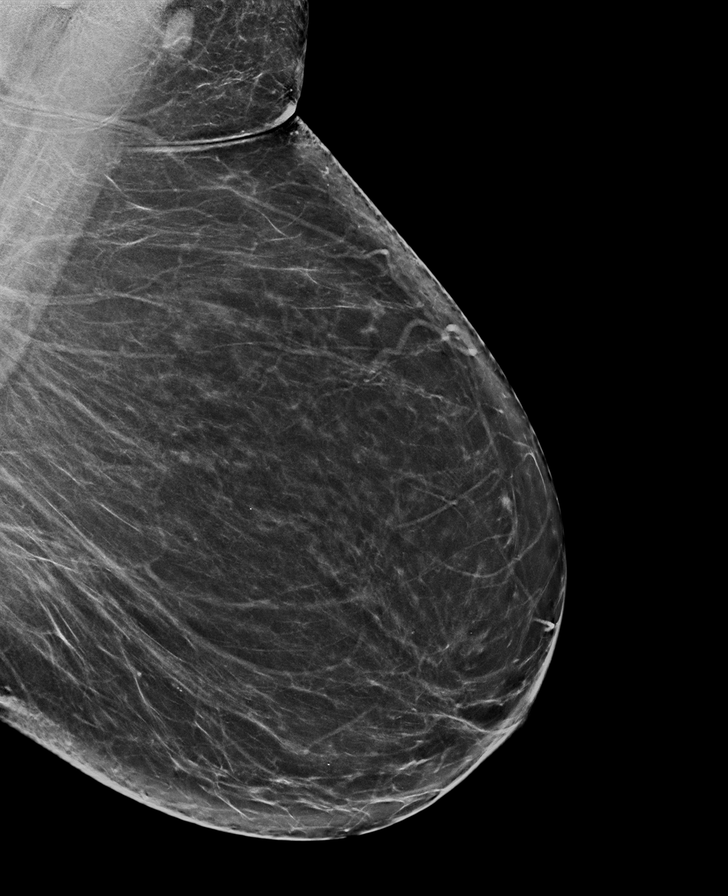

[R MLO synth-2D]
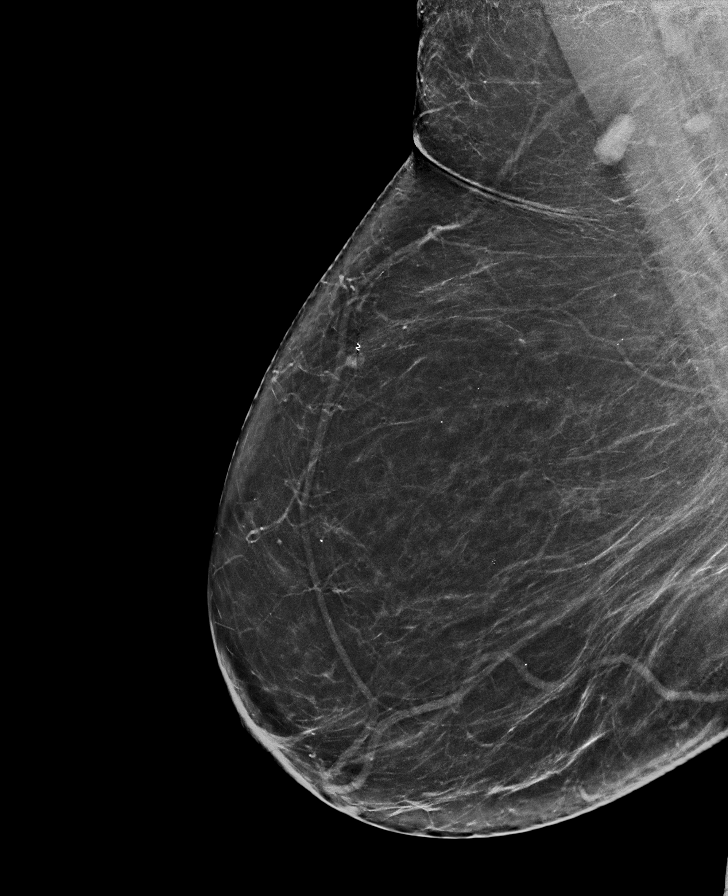

[R CC synth-2D]
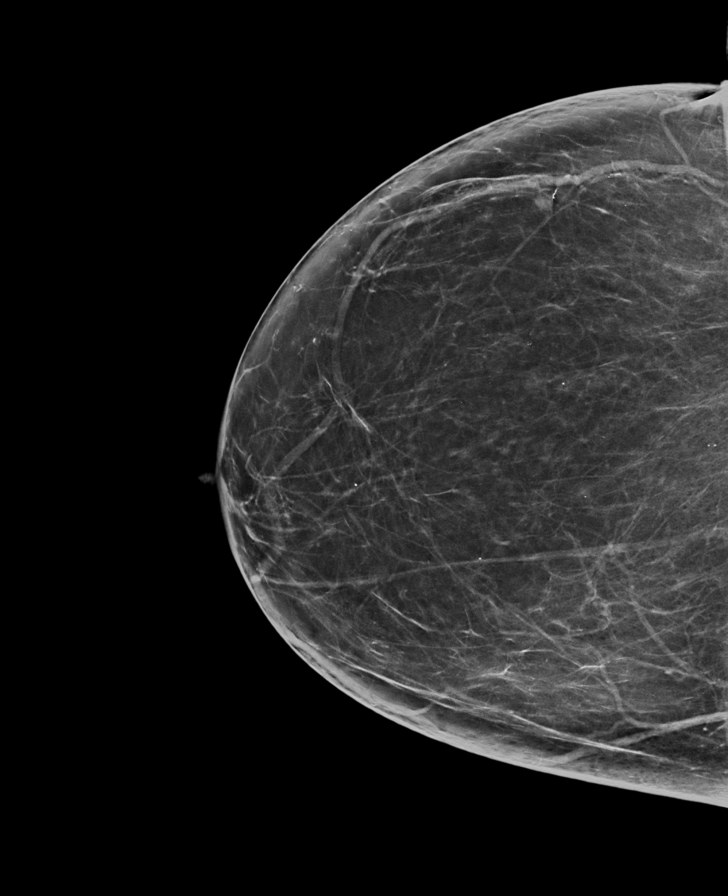

[L CC tomo · tomo slice 35/70.0]
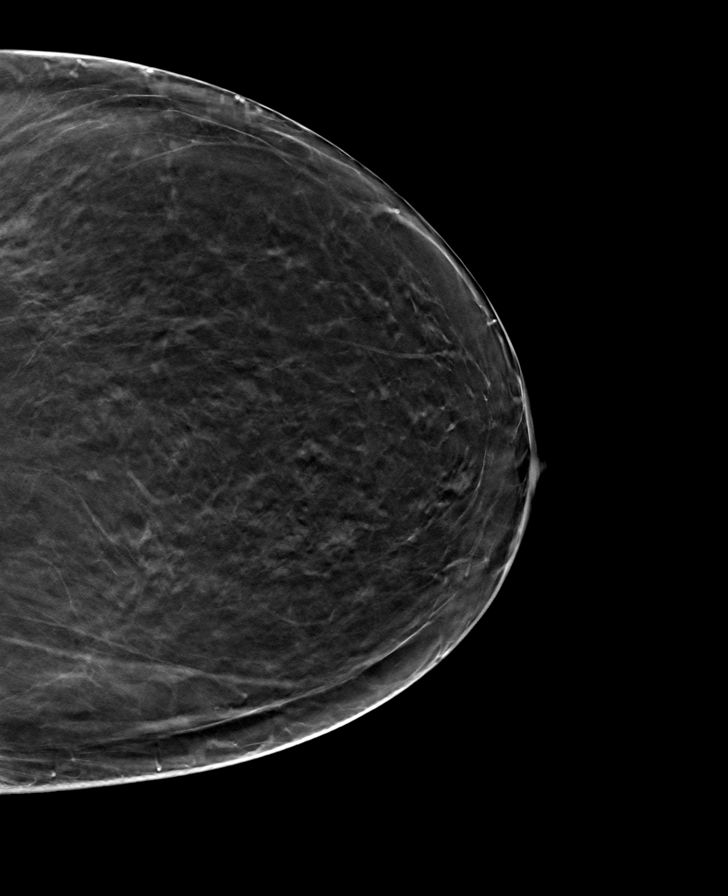

[R MLO tomo · tomo slice 42/83.0]
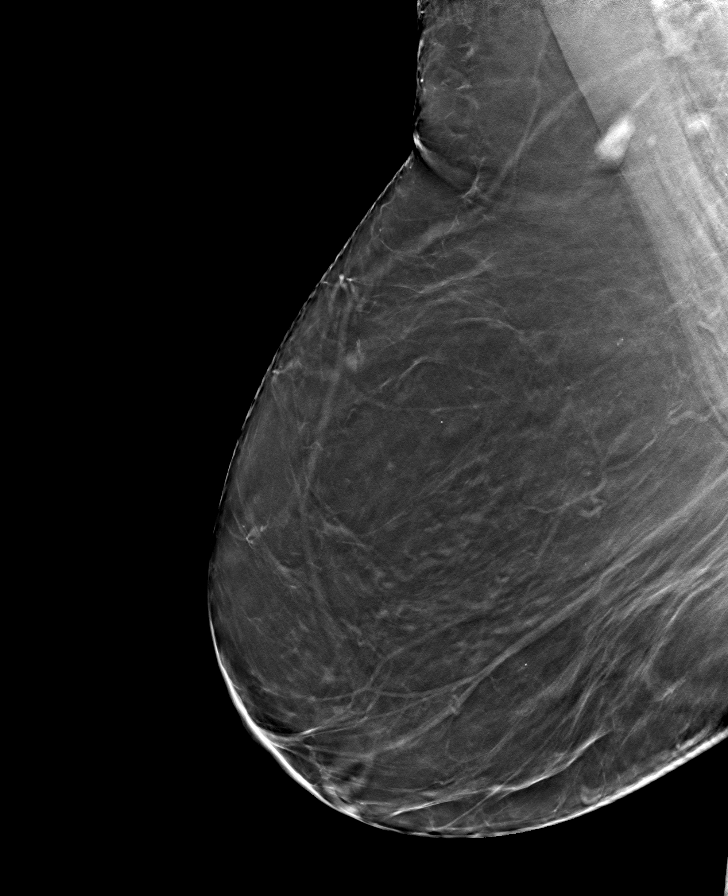

[L MLO tomo · tomo slice 41/82.0]
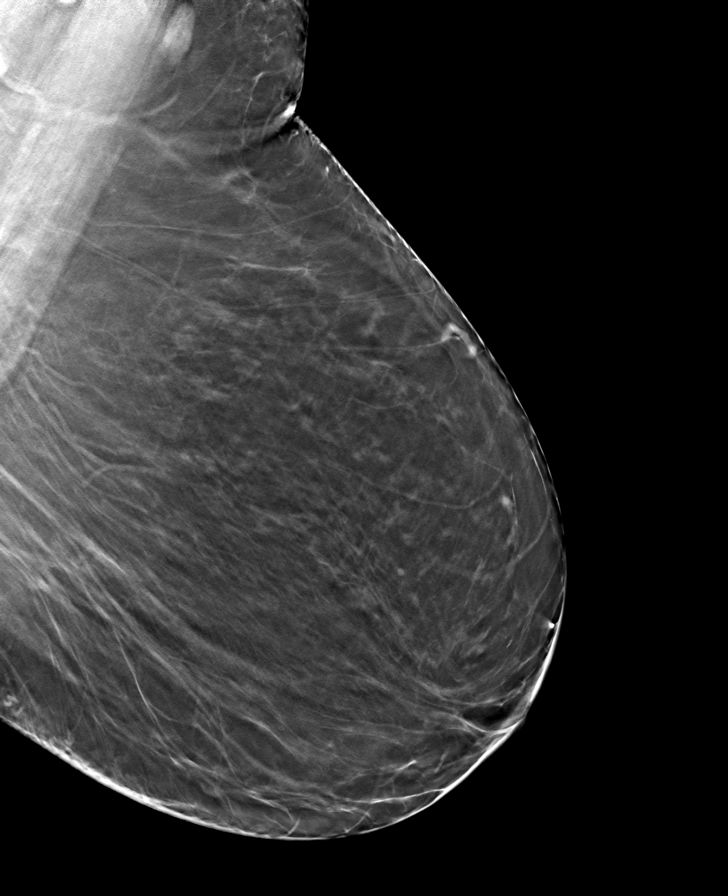

[R CC tomo · tomo slice 37/73.0]
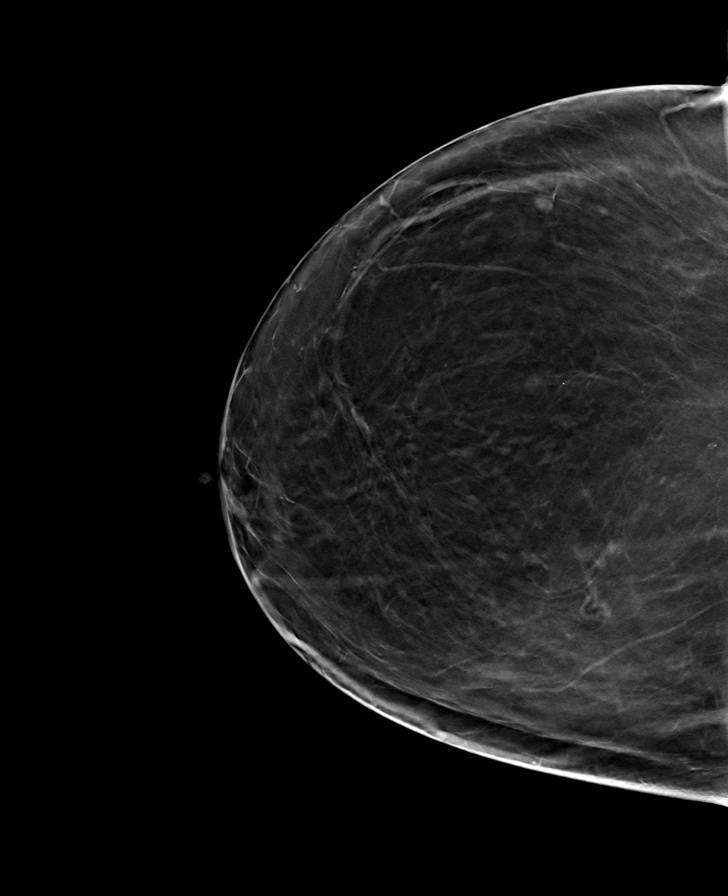

[8 of 24 positions shown; findings below may reference images not displayed]

FINDINGS: There are no findings suspicious for malignancy.
IMPRESSION: No mammographic evidence of malignancy. A result letter of this
screening mammogram will be mailed directly to the patient.

RECOMMENDATION:
Screening mammogram in one year. (Code:0E-3-N98)

BI-RADS CATEGORY  1: Negative.

## 2024-05-22 DIAGNOSIS — H52223 Regular astigmatism, bilateral: Secondary | ICD-10-CM | POA: Diagnosis not present

## 2024-05-22 DIAGNOSIS — H2513 Age-related nuclear cataract, bilateral: Secondary | ICD-10-CM | POA: Diagnosis not present

## 2024-05-22 DIAGNOSIS — H35363 Drusen (degenerative) of macula, bilateral: Secondary | ICD-10-CM | POA: Diagnosis not present

## 2024-09-17 ENCOUNTER — Ambulatory Visit

## 2024-09-17 ENCOUNTER — Ambulatory Visit
Admission: EM | Admit: 2024-09-17 | Discharge: 2024-09-17 | Disposition: A | Discharge status: Home / Self Care | Source: Home / Self Care

## 2024-09-17 DIAGNOSIS — S82831A Other fracture of upper and lower end of right fibula, initial encounter for closed fracture: Secondary | ICD-10-CM | POA: Diagnosis not present

## 2024-09-17 DIAGNOSIS — W19XXXA Unspecified fall, initial encounter: Secondary | ICD-10-CM

## 2024-09-17 NOTE — ED Provider Notes (Signed)
 " [facility]    CSN: [id number] Arrival date & time: 09/17/24  1220      History   Chief Complaint Chief Complaint  Patient presents with   Fall    HPI Vanessa Hunter is a 79 y.o. female presenting for right ankle/foot pain and swelling following a ground-level fall yesterday.  Patient slipped on ice while going out to her mailbox.  She has pain when bearing weight and moving her ankle.  She has taken the low-dose ibuprofen for pain and kept foot elevated.  She also reports using an Ace wrap recently.  She is concerned about possible underlying fracture.  No numbness or weakness.  Denies any other injuries sustained yesterday.  HPI  Past Medical History:  Diagnosis Date   Abnormal mammogram    Allergy    Hyperlipidemia    Left shoulder pain    Osteopenia    Ovarian failure    Vaginal atrophy     Patient Active Problem List   Diagnosis Date Noted   Senile purpura 03/07/2018   Elevated C-reactive protein 07/28/2017   Allergic rhinitis 06/10/2015   Dyslipidemia 06/10/2015   Osteopenia 06/10/2015   Pain in shoulder 06/10/2015   Atrophy of vagina 06/10/2015   History of diverticulitis 06/10/2015    Past Surgical History:  Procedure Laterality Date   BREAST BIOPSY Right 03-06-13   RIGHT BREAST AT 10:30, intramammary lymph node   BREAST BIOPSY Right    neg   CHOLECYSTECTOMY     COLONOSCOPY     TONSILLECTOMY     TUBAL LIGATION      OB History     Gravida  1   Para  1   Term      Preterm      AB      Living  1      SAB      IAB      Ectopic      Multiple      Live Births           Obstetric Comments  1st Menstrual Cycle:  11 1st Pregnancy:  24          Home Medications    Prior to Admission medications  Medication Sig Start Date End Date Taking? Authorizing Provider  alendronate  (FOSAMAX ) 35 MG tablet Take 1 tablet (35 mg total) by mouth every 7 (seven) days. Take with a full glass of water on an empty stomach. 09/08/20    Sowles, Krichna, MD  ezetimibe  (ZETIA ) 10 MG tablet Take 1 tablet (10 mg total) by mouth daily. 09/08/20   Sowles, Krichna, MD  loratadine  (CLARITIN ) 10 MG tablet Take 1 tablet (10 mg total) by mouth daily. 11/08/18   Sowles, Krichna, MD  rosuvastatin  (CRESTOR ) 40 MG tablet Take 1 tablet (40 mg total) by mouth daily. 09/08/20   Sowles, Krichna, MD  silver  sulfADIAZINE  (SILVADENE ) 1 % cream Apply 1 Application topically 2 (two) times daily. Patient not taking: Reported on 09/17/2024 01/10/23   Arvis Vanessa B, PA-C  valACYclovir  (VALTREX ) 1000 MG tablet Take 1 tablet (1,000 mg total) by mouth 2 (two) times daily. Patient not taking: Reported on 09/17/2024 11/08/18   Sowles, Krichna, MD  Vitamin D , Ergocalciferol , (DRISDOL ) 1.25 MG (50000 UNIT) CAPS capsule Take 1 capsule (50,000 Units total) by mouth every 7 (seven) days. 09/08/20   Sowles, Krichna, MD    Family History Family History  Problem Relation Age of Onset   Hearing loss Mother  Migraines Mother    Heart disease Father    Heart disease Brother    Cancer Maternal Aunt        breast   Breast cancer Maternal Aunt    Colon cancer Other    Cancer Other        Colon-Niece   Hyperlipidemia Sister     Social History Social History[1]   Allergies   Milk (cow), Orange juice [orange oil], and Penicillins   Review of Systems Review of Systems  Musculoskeletal:  Positive for arthralgias, gait problem and joint swelling.  Skin:  Negative for color change and wound.  Neurological:  Negative for weakness and numbness.     Physical Exam Triage Vital Signs ED Triage Vitals  Encounter Vitals Group     BP 09/17/24 1308 (!) 158/82     Girls Systolic BP Percentile --      Girls Diastolic BP Percentile --      Boys Systolic BP Percentile --      Boys Diastolic BP Percentile --      Pulse Rate 09/17/24 1308 95     Resp 09/17/24 1308 16     Temp 09/17/24 1308 97.8 F (36.6 C)     Temp Source 09/17/24 1308 Oral     SpO2 09/17/24 1308  94 %     Weight --      Height --      Head Circumference --      Peak Flow --      Pain Score 09/17/24 1315 6     Pain Loc --      Pain Education --      Exclude from Growth Chart --    No data found.  Updated Vital Signs BP (!) 158/82 (BP Location: Left Arm)   Pulse 95   Temp 97.8 F (36.6 C) (Oral)   Resp 16   SpO2 94%     Physical Exam Vitals and nursing note reviewed.  Constitutional:      General: She is not in acute distress.    Appearance: Normal appearance. She is not ill-appearing or toxic-appearing.  HENT:     Head: Normocephalic and atraumatic.  Eyes:     General: No scleral icterus.       Right eye: No discharge.        Left eye: No discharge.     Conjunctiva/sclera: Conjunctivae normal.  Cardiovascular:     Rate and Rhythm: Normal rate.     Pulses: Normal pulses.  Pulmonary:     Effort: Pulmonary effort is normal. No respiratory distress.  Musculoskeletal:     Cervical back: Neck supple.     Right ankle: Swelling and ecchymosis (lateral ankle) present. Tenderness present over the lateral malleolus and ATF ligament. Decreased range of motion. Normal pulse.  Skin:    General: Skin is dry.  Neurological:     General: No focal deficit present.     Mental Status: She is alert. Mental status is at baseline.     Motor: No weakness.     Gait: Gait abnormal.  Psychiatric:        Mood and Affect: Mood normal.        Behavior: Behavior normal.      UC Treatments / Results  Labs (all labs ordered are listed, but only abnormal results are displayed) Labs Reviewed - No data to display  EKG   Radiology DG Foot Complete Right Result Date: 09/17/2024 CLINICAL DATA:  Right foot pain  after fall last week EXAM: RIGHT FOOT COMPLETE - 3+ VIEW COMPARISON:  None Available. FINDINGS: Nondisplaced distal right fibular fracture is noted no other fracture or dislocation is noted. Mild posterior calcaneal spurring is noted. IMPRESSION: Nondisplaced distal right  fibular fracture. Electronically Signed   By: Lynwood Landy Raddle M.D.   On: 09/17/2024 13:58   DG Ankle Complete Right Result Date: 09/17/2024 CLINICAL DATA:  Right ankle pain after fall 1 week ago EXAM: RIGHT ANKLE - COMPLETE 3+ VIEW COMPARISON:  None Available. FINDINGS: Small nondisplaced fracture is seen involving the distal right fibula. The tibia is unremarkable. No dislocation is noted. Moderate soft tissue swelling is seen over lateral malleolus. IMPRESSION: Small nondisplaced distal right fibular fracture. Electronically Signed   By: Lynwood Landy Raddle M.D.   On: 09/17/2024 13:56    Procedures Procedures (including critical care time)  Medications Ordered in UC Medications - No data to display  Initial Impression / Assessment and Plan / UC Course  I have reviewed the triage vital signs and the nursing notes.  Pertinent labs & imaging results that were available during my care of the patient were reviewed by me and considered in my medical decision making (see chart for details).   79 year old female presents for right foot/ankle pain and swelling following accidental fall yesterday.  X-ray of right ankle and foot obtained today.  Small nondisplaced distal right fibular fracture noted.  Reviewed imaging results with patient.  Patient was placed in a cam boot.  Discussed fracture care guidelines.  Advised her to continue with Tylenol and low-dose ibuprofen as needed for pain relief.  Advised to avoid weightbearing until cleared by orthopedics.  Information given for her to follow-up with Ortho.   Final Clinical Impressions(s) / UC Diagnoses   Final diagnoses:  Fall, initial encounter  Other closed fracture of distal end of right fibula, initial encounter     Discharge Instructions      - You have a broken bone in your ankle, on the outside. - Wear the boot like cast for the next few weeks, until cleared by orthopedics.  Try to follow-up with Ortho in the next couple weeks at one  of the offices below.  Call to make an appointment. - May continue Tylenol and low-dose ibuprofen as needed for pain relief. -Ice and elevate extremity. - Try to avoid much weightbearing over the next couple weeks.   ED Prescriptions   None    PDMP not reviewed this encounter.    [1]  Social History Tobacco Use   Smoking status: Never   Smokeless tobacco: Never  Vaping Use   Vaping status: Never Used  Substance Use Topics   Alcohol use: Yes    Alcohol/week: 1.0 standard drink of alcohol    Types: 1 Glasses of wine per week    Comment: occasionally   Drug use: No     Arvis Jolan NOVAK, PA-C 09/17/24 1453  "

## 2024-09-17 NOTE — ED Triage Notes (Signed)
 Patient to Urgent Care with complaints of a ground-level on the ice. Reports she was walking to her mail box and slipped. Complaints of right sided ankle and foot pain.  Incident occurred yesterday afternoon.

## 2024-09-17 NOTE — Discharge Instructions (Signed)
-   You have a broken bone in your ankle, on the outside. - Wear the boot like cast for the next few weeks, until cleared by orthopedics.  Try to follow-up with Ortho in the next couple weeks at one of the offices below.  Call to make an appointment. - May continue Tylenol and low-dose ibuprofen as needed for pain relief. -Ice and elevate extremity. - Try to avoid much weightbearing over the next couple weeks.
